# Patient Record
Sex: Female | Born: 1960 | Race: Black or African American | Hispanic: No | Marital: Single | Smoking: Never smoker
Health system: Southern US, Community
[De-identification: ages and names within clinical notes are randomized; demographics above are authoritative.]

## PROBLEM LIST (undated history)

## (undated) DIAGNOSIS — I214 Non-ST elevation (NSTEMI) myocardial infarction: Secondary | ICD-10-CM

## (undated) DIAGNOSIS — I2510 Atherosclerotic heart disease of native coronary artery without angina pectoris: Secondary | ICD-10-CM

## (undated) DIAGNOSIS — F4310 Post-traumatic stress disorder, unspecified: Secondary | ICD-10-CM

## (undated) DIAGNOSIS — F319 Bipolar disorder, unspecified: Secondary | ICD-10-CM

## (undated) DIAGNOSIS — F329 Major depressive disorder, single episode, unspecified: Secondary | ICD-10-CM

## (undated) DIAGNOSIS — F419 Anxiety disorder, unspecified: Secondary | ICD-10-CM

## (undated) DIAGNOSIS — E785 Hyperlipidemia, unspecified: Secondary | ICD-10-CM

## (undated) DIAGNOSIS — F429 Obsessive-compulsive disorder, unspecified: Secondary | ICD-10-CM

## (undated) HISTORY — DX: Non-ST elevation (NSTEMI) myocardial infarction: I21.4

## (undated) HISTORY — DX: Hyperlipidemia, unspecified: E78.5

## (undated) HISTORY — PX: COSMETIC SURGERY: SHX468

## (undated) HISTORY — DX: Atherosclerotic heart disease of native coronary artery without angina pectoris: I25.10

---

## 2017-02-08 ENCOUNTER — Emergency Department: Payer: Medicare PPO

## 2017-02-08 ENCOUNTER — Other Ambulatory Visit: Payer: Self-pay

## 2017-02-08 ENCOUNTER — Emergency Department
Admission: EM | Admit: 2017-02-08 | Discharge: 2017-02-09 | Disposition: A | Discharge status: Home / Self Care | Payer: Medicare PPO | Attending: Emergency Medicine | Admitting: Emergency Medicine

## 2017-02-08 DIAGNOSIS — F329 Major depressive disorder, single episode, unspecified: Secondary | ICD-10-CM

## 2017-02-08 DIAGNOSIS — R0789 Other chest pain: Secondary | ICD-10-CM | POA: Diagnosis not present

## 2017-02-08 DIAGNOSIS — R45851 Suicidal ideations: Secondary | ICD-10-CM | POA: Diagnosis not present

## 2017-02-08 DIAGNOSIS — F332 Major depressive disorder, recurrent severe without psychotic features: Secondary | ICD-10-CM | POA: Diagnosis not present

## 2017-02-08 DIAGNOSIS — F4310 Post-traumatic stress disorder, unspecified: Secondary | ICD-10-CM | POA: Diagnosis not present

## 2017-02-08 DIAGNOSIS — R202 Paresthesia of skin: Secondary | ICD-10-CM | POA: Diagnosis not present

## 2017-02-08 DIAGNOSIS — Z9114 Patient's other noncompliance with medication regimen: Secondary | ICD-10-CM | POA: Diagnosis present

## 2017-02-08 DIAGNOSIS — R200 Anesthesia of skin: Secondary | ICD-10-CM

## 2017-02-08 HISTORY — DX: Bipolar disorder, unspecified: F31.9

## 2017-02-08 HISTORY — DX: Anxiety disorder, unspecified: F41.9

## 2017-02-08 HISTORY — DX: Obsessive-compulsive disorder, unspecified: F42.9

## 2017-02-08 HISTORY — DX: Major depressive disorder, single episode, unspecified: F32.9

## 2017-02-08 HISTORY — DX: Post-traumatic stress disorder, unspecified: F43.10

## 2017-02-08 LAB — CBC
HEMATOCRIT: 40 % (ref 35.0–47.0)
Hemoglobin: 13.7 g/dL (ref 12.0–16.0)
MCH: 28.1 pg (ref 26.0–34.0)
MCHC: 34.3 g/dL (ref 32.0–36.0)
MCV: 82.1 fL (ref 80.0–100.0)
PLATELETS: 217 10*3/uL (ref 150–440)
RBC: 4.87 MIL/uL (ref 3.80–5.20)
RDW: 14.4 % (ref 11.5–14.5)
WBC: 5.6 10*3/uL (ref 3.6–11.0)

## 2017-02-08 LAB — URINE DRUG SCREEN, QUALITATIVE (ARMC ONLY)
AMPHETAMINES, UR SCREEN: NOT DETECTED
BENZODIAZEPINE, UR SCRN: POSITIVE — AB
Barbiturates, Ur Screen: NOT DETECTED
Cannabinoid 50 Ng, Ur ~~LOC~~: NOT DETECTED
Cocaine Metabolite,Ur ~~LOC~~: NOT DETECTED
MDMA (ECSTASY) UR SCREEN: NOT DETECTED
METHADONE SCREEN, URINE: NOT DETECTED
OPIATE, UR SCREEN: NOT DETECTED
Phencyclidine (PCP) Ur S: NOT DETECTED
Tricyclic, Ur Screen: NOT DETECTED

## 2017-02-08 LAB — COMPREHENSIVE METABOLIC PANEL
ALBUMIN: 4 g/dL (ref 3.5–5.0)
ALT: 12 U/L — ABNORMAL LOW (ref 14–54)
ANION GAP: 8 (ref 5–15)
AST: 18 U/L (ref 15–41)
Alkaline Phosphatase: 69 U/L (ref 38–126)
BILIRUBIN TOTAL: 0.6 mg/dL (ref 0.3–1.2)
BUN: 12 mg/dL (ref 6–20)
CHLORIDE: 107 mmol/L (ref 101–111)
CO2: 25 mmol/L (ref 22–32)
Calcium: 9 mg/dL (ref 8.9–10.3)
Creatinine, Ser: 0.83 mg/dL (ref 0.44–1.00)
GFR calc Af Amer: >60 mL/min (ref >60)
GFR calc non Af Amer: >60 mL/min (ref >60)
GLUCOSE: 92 mg/dL (ref 65–99)
POTASSIUM: 3.7 mmol/L (ref 3.5–5.1)
SODIUM: 140 mmol/L (ref 135–145)
TOTAL PROTEIN: 7.1 g/dL (ref 6.5–8.1)

## 2017-02-08 LAB — ETHANOL: Alcohol, Ethyl (B): <10 mg/dL (ref <10)

## 2017-02-08 LAB — ACETAMINOPHEN LEVEL

## 2017-02-08 LAB — SALICYLATE LEVEL: Salicylate Lvl: <7 mg/dL (ref 2.8–30.0)

## 2017-02-08 MED ORDER — LORAZEPAM 2 MG PO TABS
ORAL_TABLET | ORAL | Status: AC
  Administered 2017-02-08: 2 mg via ORAL
  Filled 2017-02-08: qty 1

## 2017-02-08 MED ORDER — VENLAFAXINE HCL ER 75 MG PO CP24
75.0000 mg | ORAL_CAPSULE | Freq: Every day | ORAL | Status: DC
  Administered 2017-02-09: 75 mg via ORAL
  Filled 2017-02-08: qty 1

## 2017-02-08 MED ORDER — LORAZEPAM 2 MG PO TABS
2.0000 mg | ORAL_TABLET | Freq: Every day | ORAL | Status: DC
  Administered 2017-02-08: 2 mg via ORAL

## 2017-02-08 NOTE — ED Notes (Signed)
Patient transported to CT with a tech per CT request.

## 2017-02-08 NOTE — ED Triage Notes (Addendum)
Pt to the ER via ems, pt is SI x 2 days. Walked to the neighbors house around the corner and spoke with them and they called 911 for her. Pt also reports chest pain earlier associated with her anxiety and numbness to her right hand and right leg.
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
## 2017-02-08 NOTE — ED Notes (Signed)
Pt is alert and oriented on admission to the [facility]. Pt mood is depressed and affect is sad/tearful. Pt does state that she feels safe at this time. Writer discussed tx plan and administered bedtime medications. 15 minute checks are ongoing for safety.

## 2017-02-08 NOTE — ED Notes (Signed)
TTS with pt  
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
## 2017-02-08 NOTE — ED Provider Notes (Addendum)
[facility] Emergency Department Provider Note  ____________________________________________  Time seen: Approximately 8:23 PM  I have reviewed the triage vital signs and the nursing notes.   HISTORY  Chief Complaint Suicidal    HPI Brianna Mclean is a 56 y.o. female w/ a hx of bipolar d/o, PTSD, anxiety, OCD presenting w/ SI, off meds. The patient reports that she has significant stressors including taking care of her children and grandchildren, so she has discontinued her medications to "be more awake for them."  Over the last several weeks, the patient has become suicidal without a plan.  No HI or hallucinations.  Medically, the patient reports several months of a constant numbness in the right fingertips which occasionally radiates up the arm, most associated with taking care of small children.  She has no associated shortness of breath, headache, visual changes or speech changes.  Occasionally she gets a chest tightness which is nonexertional, and last for several seconds.   Past Medical History:  Diagnosis Date  . Anxiety   . Bipolar 1 disorder, depressed (HCC)   . Major depression   . Obsessive compulsive disorder   . PTSD (post-traumatic stress disorder)     There are no active problems to display for this patient.     Current Outpatient Rx  . Order #: [id number] Class: Historical Med  . Order #: [id number] Class: Historical Med    Allergies Percocet [oxycodone-acetaminophen]  No family history on file.  Social History Social History   Tobacco Use  . Smoking status: Never Smoker  . Smokeless tobacco: Never Used  Substance Use Topics  . Alcohol use: No    Frequency: Never  . Drug use: No    Review of Systems Constitutional: No fever/chills. Eyes: No visual changes. ENT: No sore throat. No congestion or rhinorrhea. Cardiovascular: Positive chest pain. Denies palpitations. Respiratory: Denies shortness of breath.  No  cough. Gastrointestinal: No abdominal pain.  No nausea, no vomiting.  No diarrhea.  No constipation. Genitourinary: Negative for dysuria. Musculoskeletal: Negative for back pain. Skin: Negative for rash. Neurological: Negative for headaches. No focal , tingling or weakness.  Positive right fingertip numbness. Psychiatric:Positive SI.  Negative HI or hallucinations ____________________________________________   PHYSICAL EXAM:  VITAL SIGNS: ED Triage Vitals [02/08/17 1937]  Enc Vitals Group     BP      Pulse      Resp      Temp      Temp src      SpO2      Weight 165 lb (74.8 kg)     Height 5\' 4"  (1.626 m)     Head Circumference      Peak Flow      Pain Score      Pain Loc      Pain Edu?      Excl. in GC?     Constitutional: Alert and oriented.  Tearful but well appearing and in no acute distress. Answers questions appropriately. Eyes: Conjunctivae are normal.  EOMI. No scleral icterus. Head: Atraumatic. Nose: No congestion/rhinnorhea. Mouth/Throat: Mucous membranes are moist.  Neck: No stridor.  Supple.  No JVD.  No meningismus. Cardiovascular: Normal rate, regular rhythm. No murmurs, rubs or gallops.  Respiratory: Normal respiratory effort.  No accessory muscle use or retractions. Lungs CTAB.  No wheezes, rales or ronchi. Gastrointestinal: Soft, nontender and nondistended.  No guarding or rebound.  No peritoneal signs. Musculoskeletal: No LE edema. No ttp in the calves or palpable cords.  Negative Homan's  sign. Neurologic: Alert and oriented 3. Speech is clear. Face and smile symmetric. Tongue is midline. No pronator drift. 5 out of 5 grip, biceps, triceps, hip flexors, plantar flexion and dorsiflexion. Normal sensation to light touch in the L upper and lower extremities, and face.  Decreased sensation to light touch in the entire RUE and RLE. Normal Gait without ataxia. Skin:  Skin is warm, dry and intact. No rash noted. Psychiatric: Mood and affect are normal. Speech  and behavior are normal.  Normal judgement.  ____________________________________________   LABS (all labs ordered are listed, but only abnormal results are displayed)  Labs Reviewed  COMPREHENSIVE METABOLIC PANEL  ETHANOL  SALICYLATE LEVEL  ACETAMINOPHEN LEVEL  CBC  URINE DRUG SCREEN, QUALITATIVE ([facility] ONLY)  POC URINE PREG, ED   ____________________________________________  EKG  ED ECG REPORT I, Rockne MenghiniNorman, Anne-Caroline, the attending physician, personally viewed and interpreted this ECG.   Date: 02/08/2017  EKG Time: 2157  Rate: 63  Rhythm: normal sinus rhythm  Axis: normal  Intervals:none  ST&T Change: No STEMI  ____________________________________________  RADIOLOGY  No results found.  ____________________________________________   PROCEDURES  Procedure(s) performed: None  Procedures  Critical Care performed: No ____________________________________________   INITIAL IMPRESSION / ASSESSMENT AND PLAN / ED COURSE  Pertinent labs & imaging results that were available during my care of the patient were reviewed by me and considered in my medical decision making (see chart for details).  56 y.o. female with a history of multiple psychiatric illnesses, off her medications, presenting with SI.  The patient also has several months of a constant right upper extremity numbness and some intermittent atypical chest pain.  We will get a screening EKG, but her chest pain does not sound typical of ACS or MI.  In addition, we will get a CT scan of the head, but given the length of the patient's numbness, it is unlikely that her symptoms are related to stroke, particularly an acute stroke.  ----------------------------------------- 10:43 PM on 02/08/2017 -----------------------------------------  The patient CT scan does not show any acute intracranial process.  Her EKG is reassuring.  At this time, the patient is medically cleared for psychiatric  disposition.  ____________________________________________  FINAL CLINICAL IMPRESSION(S) / ED DIAGNOSES  Final diagnoses:  None         NEW MEDICATIONS STARTED DURING THIS VISIT:  This SmartLink is deprecated. Use AVSMEDLIST instead to display the medication list for a patient.    Rockne MenghiniNorman, Anne-Caroline, MD 02/08/17 2204    Rockne MenghiniNorman, Anne-Caroline, MD 02/08/17 [id number]    Rockne MenghiniNorman, Anne-Caroline, MD 02/08/17 [phone]

## 2017-02-08 NOTE — ED Notes (Signed)
ED Provider at bedside. 

## 2017-02-09 DIAGNOSIS — F329 Major depressive disorder, single episode, unspecified: Secondary | ICD-10-CM

## 2017-02-09 DIAGNOSIS — F4310 Post-traumatic stress disorder, unspecified: Secondary | ICD-10-CM
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
## 2017-02-09 NOTE — ED Provider Notes (Signed)
Dr. Toni Amendlapacs recommends discharge.   Merrily Brittleifenbark, Nicolette Gieske, MD 02/09/17 1443

## 2017-02-09 NOTE — Discharge Instructions (Signed)
It was a pleasure to take care of you today, and thank you for coming to our emergency department.  If you have any questions or concerns before leaving please ask the nurse to grab me and I'm more than happy to go through your aftercare instructions again.  If you were prescribed any opioid pain medication today such as Norco, Vicodin, Percocet, morphine, hydrocodone, or oxycodone please make sure you do not drive when you are taking this medication as it can alter your ability to drive safely.  If you have any concerns once you are home that you are not improving or are in fact getting worse before you can make it to your follow-up appointment, please do not hesitate to call 911 and come back for further evaluation.  Merrily BrittleNeil Aleiyah Halpin, MD  Results for orders placed or performed during the hospital encounter of 02/08/17  Comprehensive metabolic panel  Result Value Ref Range   Sodium 140 135 - 145 mmol/L   Potassium 3.7 3.5 - 5.1 mmol/L   Chloride 107 101 - 111 mmol/L   CO2 25 22 - 32 mmol/L   Glucose, Bld 92 65 - 99 mg/dL   BUN 12 6 - 20 mg/dL   Creatinine, Ser 1.610.83 0.44 - 1.00 mg/dL   Calcium 9.0 8.9 - 09.610.3 mg/dL   Total Protein 7.1 6.5 - 8.1 g/dL   Albumin 4.0 3.5 - 5.0 g/dL   AST 18 15 - 41 U/L   ALT 12 (L) 14 - 54 U/L   Alkaline Phosphatase 69 38 - 126 U/L   Total Bilirubin 0.6 0.3 - 1.2 mg/dL   GFR calc non Af Amer >60 >60 mL/min   GFR calc Af Amer >60 >60 mL/min   Anion gap 8 5 - 15  Ethanol  Result Value Ref Range   Alcohol, Ethyl (B) <10 <10 mg/dL  Salicylate level  Result Value Ref Range   Salicylate Lvl <7.0 2.8 - 30.0 mg/dL  Acetaminophen level  Result Value Ref Range   Acetaminophen (Tylenol), Serum <10 (L) 10 - 30 ug/mL  cbc  Result Value Ref Range   WBC 5.6 3.6 - 11.0 K/uL   RBC 4.87 3.80 - 5.20 MIL/uL   Hemoglobin 13.7 12.0 - 16.0 g/dL   HCT 04.540.0 40.935.0 - 81.147.0 %   MCV 82.1 80.0 - 100.0 fL   MCH 28.1 26.0 - 34.0 pg   MCHC 34.3 32.0 - 36.0 g/dL   RDW 91.414.4 78.211.5 -  95.614.5 %   Platelets 217 150 - 440 K/uL  Urine Drug Screen, Qualitative  Result Value Ref Range   Tricyclic, Ur Screen NONE DETECTED NONE DETECTED   Amphetamines, Ur Screen NONE DETECTED NONE DETECTED   MDMA (Ecstasy)Ur Screen NONE DETECTED NONE DETECTED   Cocaine Metabolite,Ur [place] NONE DETECTED NONE DETECTED   Opiate, Ur Screen NONE DETECTED NONE DETECTED   Phencyclidine (PCP) Ur S NONE DETECTED NONE DETECTED   Cannabinoid 50 Ng, Ur [place] NONE DETECTED NONE DETECTED   Barbiturates, Ur Screen NONE DETECTED NONE DETECTED   Benzodiazepine, Ur Scrn POSITIVE (A) NONE DETECTED   Methadone Scn, Ur NONE DETECTED NONE DETECTED   Ct Head Wo Contrast  Result Date: 02/08/2017 CLINICAL DATA:  Right hand and leg numbness. Subacute neurologic deficit. EXAM: CT HEAD WITHOUT CONTRAST TECHNIQUE: Contiguous axial images were obtained from the base of the skull through the vertex without intravenous contrast. COMPARISON:  None. FINDINGS: Brain: No intracranial hemorrhage, mass effect, or midline shift. No hydrocephalus. The basilar cisterns are  patent. No evidence of territorial infarct or acute ischemia. No extra-axial or intracranial fluid collection. Vascular: No hyperdense vessel or unexpected calcification. Skull: No fracture or focal lesion. Sinuses/Orbits: Frontal sinuses are hypo pneumatized. No acute finding. Other: None. IMPRESSION: No acute intracranial abnormality. Electronically Signed   By: Rubye OaksMelanie  Ehinger M.D.   On: 02/08/2017 21:13

## 2017-02-09 NOTE — ED Notes (Signed)
Pt A & O X4. Denies SI, HI, AVh and pain at this time. Presents with depressed affect and mood with poor eye contact. States "I feel ok, compare to yesterday". Reported poor appetite, "I don't feel like eating now, I just want to rest more". Pt d/c home as per MD's order. Compliant with medications when offered. Denies adverse drug reactions when assessed. D/C instructions reviewed with pt who verbalized understanding. Compliance with d/c instructions encouraged. Emotional support provided. VSS, pt ambulatory with a steady gait. Q 15 minutes safety checks maintained till time of d/c without self harm gestures. Appears to be in no physical distress at time of departure. Pt was picked up in the ED front lobby by her daughter.

## 2017-08-10 ENCOUNTER — Emergency Department: Payer: Medicare PPO

## 2017-08-10 ENCOUNTER — Inpatient Hospital Stay
Admission: EM | Admit: 2017-08-10 | Discharge: 2017-08-12 | DRG: 280 | Disposition: A | Discharge status: Home / Self Care | Payer: Medicare PPO | Attending: Family Medicine | Admitting: Family Medicine

## 2017-08-10 ENCOUNTER — Encounter
Admission: EM | Disposition: A | Discharge status: Home / Self Care | Payer: Self-pay | Source: Home / Self Care | Attending: Family Medicine

## 2017-08-10 ENCOUNTER — Other Ambulatory Visit: Payer: Self-pay

## 2017-08-10 DIAGNOSIS — I214 Non-ST elevation (NSTEMI) myocardial infarction: Secondary | ICD-10-CM | POA: Diagnosis present

## 2017-08-10 DIAGNOSIS — E785 Hyperlipidemia, unspecified: Secondary | ICD-10-CM | POA: Diagnosis present

## 2017-08-10 DIAGNOSIS — R001 Bradycardia, unspecified: Secondary | ICD-10-CM | POA: Diagnosis present

## 2017-08-10 DIAGNOSIS — I2510 Atherosclerotic heart disease of native coronary artery without angina pectoris: Secondary | ICD-10-CM

## 2017-08-10 DIAGNOSIS — I213 ST elevation (STEMI) myocardial infarction of unspecified site: Secondary | ICD-10-CM | POA: Diagnosis present

## 2017-08-10 DIAGNOSIS — Z823 Family history of stroke: Secondary | ICD-10-CM | POA: Diagnosis not present

## 2017-08-10 DIAGNOSIS — Z8349 Family history of other endocrine, nutritional and metabolic diseases: Secondary | ICD-10-CM | POA: Diagnosis not present

## 2017-08-10 DIAGNOSIS — F411 Generalized anxiety disorder: Secondary | ICD-10-CM | POA: Diagnosis present

## 2017-08-10 DIAGNOSIS — F419 Anxiety disorder, unspecified: Secondary | ICD-10-CM | POA: Diagnosis present

## 2017-08-10 DIAGNOSIS — Z7982 Long term (current) use of aspirin: Secondary | ICD-10-CM | POA: Diagnosis not present

## 2017-08-10 DIAGNOSIS — I2542 Coronary artery dissection: Secondary | ICD-10-CM | POA: Diagnosis present

## 2017-08-10 DIAGNOSIS — F329 Major depressive disorder, single episode, unspecified: Secondary | ICD-10-CM | POA: Diagnosis present

## 2017-08-10 HISTORY — PX: LEFT HEART CATH AND CORONARY ANGIOGRAPHY: CATH118249

## 2017-08-10 HISTORY — DX: Non-ST elevation (NSTEMI) myocardial infarction: I21.4

## 2017-08-10 LAB — GLUCOSE, CAPILLARY: Glucose-Capillary: 85 mg/dL (ref 65–99)

## 2017-08-10 LAB — CBC
HCT: 40.5 % (ref 35.0–47.0)
Hemoglobin: 13.9 g/dL (ref 12.0–16.0)
MCH: 28.3 pg (ref 26.0–34.0)
MCHC: 34.4 g/dL (ref 32.0–36.0)
MCV: 82.4 fL (ref 80.0–100.0)
PLATELETS: 261 10*3/uL (ref 150–440)
RBC: 4.91 MIL/uL (ref 3.80–5.20)
RDW: 14.4 % (ref 11.5–14.5)
WBC: 8.3 10*3/uL (ref 3.6–11.0)

## 2017-08-10 LAB — BASIC METABOLIC PANEL
Anion gap: 11 (ref 5–15)
BUN: 14 mg/dL (ref 6–20)
CALCIUM: 9.2 mg/dL (ref 8.9–10.3)
CHLORIDE: 105 mmol/L (ref 101–111)
CO2: 24 mmol/L (ref 22–32)
Creatinine, Ser: 0.81 mg/dL (ref 0.44–1.00)
GFR calc non Af Amer: >60 mL/min (ref >60)
Glucose, Bld: 125 mg/dL — ABNORMAL HIGH (ref 65–99)
Potassium: 3.5 mmol/L (ref 3.5–5.1)
SODIUM: 140 mmol/L (ref 135–145)

## 2017-08-10 LAB — MRSA PCR SCREENING: MRSA BY PCR: POSITIVE — AB

## 2017-08-10 LAB — TROPONIN I

## 2017-08-10 SURGERY — LEFT HEART CATH AND CORONARY ANGIOGRAPHY
Anesthesia: Moderate Sedation

## 2017-08-10 MED ORDER — GABAPENTIN 600 MG PO TABS
600.0000 mg | ORAL_TABLET | Freq: Three times a day (TID) | ORAL | Status: DC
  Administered 2017-08-10 – 2017-08-12 (×5): 600 mg via ORAL
  Filled 2017-08-10 (×6): qty 1

## 2017-08-10 MED ORDER — FENTANYL CITRATE (PF) 100 MCG/2ML IJ SOLN
INTRAMUSCULAR | Status: DC | PRN
  Administered 2017-08-10: 25 ug via INTRAVENOUS

## 2017-08-10 MED ORDER — HEPARIN (PORCINE) IN NACL 100-0.45 UNIT/ML-% IJ SOLN
850.0000 [IU]/h | INTRAMUSCULAR | Status: DC
  Filled 2017-08-10: qty 250

## 2017-08-10 MED ORDER — SODIUM CHLORIDE 0.9% FLUSH
3.0000 mL | Freq: Two times a day (BID) | INTRAVENOUS | Status: DC
  Administered 2017-08-11 (×2): 3 mL via INTRAVENOUS

## 2017-08-10 MED ORDER — SODIUM CHLORIDE 0.9% FLUSH: 3.0000 mL | INTRAVENOUS | Status: DC | PRN

## 2017-08-10 MED ORDER — NITROGLYCERIN 0.4 MG SL SUBL: 0.4000 mg | SUBLINGUAL_TABLET | SUBLINGUAL | Status: DC | PRN

## 2017-08-10 MED ORDER — HEPARIN (PORCINE) IN NACL 2-0.9 UNITS/ML
INTRAMUSCULAR | Status: AC | PRN
  Administered 2017-08-10: 1000 mL via INTRA_ARTERIAL

## 2017-08-10 MED ORDER — VENLAFAXINE HCL ER 75 MG PO CP24
225.0000 mg | ORAL_CAPSULE | Freq: Every day | ORAL | Status: DC
  Administered 2017-08-11 – 2017-08-12 (×2): 225 mg via ORAL
  Filled 2017-08-10 (×2): qty 3

## 2017-08-10 MED ORDER — MIDAZOLAM HCL 2 MG/2ML IJ SOLN
INTRAMUSCULAR | Status: AC
  Filled 2017-08-10: qty 2

## 2017-08-10 MED ORDER — HEPARIN SODIUM (PORCINE) 5000 UNIT/ML IJ SOLN
4000.0000 [IU] | Freq: Once | INTRAMUSCULAR | Status: AC
  Administered 2017-08-10: 4000 [IU] via INTRAVENOUS

## 2017-08-10 MED ORDER — FENTANYL CITRATE (PF) 100 MCG/2ML IJ SOLN
INTRAMUSCULAR | Status: AC
  Filled 2017-08-10: qty 2

## 2017-08-10 MED ORDER — SODIUM CHLORIDE 0.9 % IV SOLN: 250.0000 mL | INTRAVENOUS | Status: DC | PRN

## 2017-08-10 MED ORDER — VERAPAMIL HCL 2.5 MG/ML IV SOLN
INTRAVENOUS | Status: DC | PRN
  Administered 2017-08-10: 5 mg via INTRAVENOUS

## 2017-08-10 MED ORDER — LIDOCAINE HCL (PF) 1 % IJ SOLN
INTRAMUSCULAR | Status: AC
  Filled 2017-08-10: qty 30

## 2017-08-10 MED ORDER — ASPIRIN 81 MG PO CHEW
81.0000 mg | CHEWABLE_TABLET | Freq: Every day | ORAL | Status: DC
  Administered 2017-08-11 – 2017-08-12 (×2): 81 mg via ORAL
  Filled 2017-08-10 (×2): qty 1

## 2017-08-10 MED ORDER — SODIUM CHLORIDE 0.9 % IV SOLN
INTRAVENOUS | Status: AC
  Administered 2017-08-10: 125 mL/h via INTRAVENOUS

## 2017-08-10 MED ORDER — MIDAZOLAM HCL 2 MG/2ML IJ SOLN
INTRAMUSCULAR | Status: DC | PRN
  Administered 2017-08-10: 0.5 mg via INTRAVENOUS

## 2017-08-10 MED ORDER — LORAZEPAM 2 MG PO TABS: 2.0000 mg | ORAL_TABLET | Freq: Two times a day (BID) | ORAL | Status: DC | PRN

## 2017-08-10 MED ORDER — ATORVASTATIN CALCIUM 20 MG PO TABS
40.0000 mg | ORAL_TABLET | Freq: Every day | ORAL | Status: DC
  Administered 2017-08-10 – 2017-08-11 (×2): 40 mg via ORAL
  Filled 2017-08-10 (×2): qty 2

## 2017-08-10 MED ORDER — HEPARIN (PORCINE) IN NACL 1000-0.9 UT/500ML-% IV SOLN
INTRAVENOUS | Status: AC
  Filled 2017-08-10: qty 1000

## 2017-08-10 MED ORDER — VERAPAMIL HCL 2.5 MG/ML IV SOLN
INTRAVENOUS | Status: AC
  Filled 2017-08-10: qty 2

## 2017-08-10 MED ORDER — ONDANSETRON HCL 4 MG/2ML IJ SOLN: 4.0000 mg | Freq: Four times a day (QID) | INTRAMUSCULAR | Status: DC | PRN

## 2017-08-10 MED ORDER — IOPAMIDOL (ISOVUE-300) INJECTION 61%
INTRAVENOUS | Status: DC | PRN
  Administered 2017-08-10: 60 mL via INTRA_ARTERIAL

## 2017-08-10 MED ORDER — CARVEDILOL 3.125 MG PO TABS
3.1250 mg | ORAL_TABLET | Freq: Two times a day (BID) | ORAL | Status: DC
  Administered 2017-08-10 – 2017-08-12 (×4): 3.125 mg via ORAL
  Filled 2017-08-10 (×4): qty 1

## 2017-08-10 MED ORDER — ACETAMINOPHEN 325 MG PO TABS
650.0000 mg | ORAL_TABLET | ORAL | Status: DC | PRN
  Administered 2017-08-11: 650 mg via ORAL
  Filled 2017-08-10: qty 2

## 2017-08-10 SURGICAL SUPPLY — 11 items
CATH INFINITI 5 FR JL3.5 (CATHETERS) ×3 IMPLANT
CATH INFINITI 5FR ANG PIGTAIL (CATHETERS) ×3 IMPLANT
CATH INFINITI JR4 5F (CATHETERS) ×3 IMPLANT
DEVICE INFLAT 30 PLUS (MISCELLANEOUS) IMPLANT
DEVICE RAD COMP TR BAND LRG (VASCULAR PRODUCTS) ×3 IMPLANT
KIT MANI 3VAL PERCEP (MISCELLANEOUS) ×3 IMPLANT
NEEDLE PERC 21GX4CM (NEEDLE) ×3 IMPLANT
PACK CARDIAC CATH (CUSTOM PROCEDURE TRAY) ×3 IMPLANT
SHEATH RAIN RADIAL 21G 6FR (SHEATH) ×3 IMPLANT
WIRE HITORQ VERSACORE ST 145CM (WIRE) ×3 IMPLANT
WIRE ROSEN-J .035X260CM (WIRE) ×3 IMPLANT

## 2017-08-10 NOTE — H&P (Signed)
[facility] [place] [facility]   PATIENT NAME: Brianna Mclean    MR#:  [id number]  DATE OF BIRTH:  1960-05-30  DATE OF ADMISSION:  08/10/2017  PRIMARY CARE PHYSICIAN: Patient, No Pcp Per   REQUESTING/REFERRING PHYSICIAN: End, MD  CHIEF COMPLAINT:   Chief Complaint  Patient presents with  . Chest Pain    HISTORY OF PRESENT ILLNESS:  Brianna Mclean  is a 57 y.o. female who presents with chest pain.  Patient states started this afternoon, and that it was "not a panic attack".  She came to the ED for evaluation, and here had an initial negative troponin, but did have some EKG changes.  While in the ED her rhythm changed into wide-complex, and she had persistent symptoms.  Despite negative troponin she was taken to Cath Lab by cardiology and found to have possible dissection -OM 2.  Patient was stable and pain-free after cath, she was moved to the ICU and hospitalist were called  PAST MEDICAL HISTORY:   Past Medical History:  Diagnosis Date  . Anxiety   . Bipolar 1 disorder, depressed (HCC)   . Major depression   . Obsessive compulsive disorder   . PTSD (post-traumatic stress disorder)      PAST SURGICAL HISTORY:   Past Surgical History:  Procedure Laterality Date  . COSMETIC SURGERY       SOCIAL HISTORY:   Social History   Tobacco Use  . Smoking status: Never Smoker  . Smokeless tobacco: Never Used  Substance Use Topics  . Alcohol use: No    Frequency: Never     FAMILY HISTORY:   Family History  Problem Relation Age of Onset  . Anxiety disorder Mother   . Hyperlipidemia Mother   . Stroke Mother   . Depression Father      DRUG ALLERGIES:   Allergies  Allergen Reactions  . Percocet [Oxycodone-Acetaminophen] Itching    MEDICATIONS AT HOME:   Prior to Admission medications   Medication Sig Start Date End Date Taking? Authorizing Provider  aspirin 325 MG tablet Take 325 mg by mouth daily.   Yes [provider]   gabapentin (NEURONTIN) 600 MG tablet Take 600 mg by mouth 3 (three) times daily. 07/16/17  Yes [provider]  LORazepam (ATIVAN) 2 MG tablet Take 1 tablet by mouth 2 (two) times daily as needed. 12/17/16  Yes [provider]  venlafaxine XR (EFFEXOR-XR) 75 MG 24 hr capsule Take 225 mg by mouth daily. With food 01/14/17  Yes [provider]    REVIEW OF SYSTEMS:  Review of Systems  Constitutional: Negative for chills, fever, malaise/fatigue and weight loss.  HENT: Negative for ear pain, hearing loss and tinnitus.   Eyes: Negative for blurred vision, double vision, pain and redness.  Respiratory: Positive for shortness of breath. Negative for cough and hemoptysis.   Cardiovascular: Positive for chest pain. Negative for palpitations, orthopnea and leg swelling.  Gastrointestinal: Negative for abdominal pain, constipation, diarrhea, nausea and vomiting.  Genitourinary: Negative for dysuria, frequency and hematuria.  Musculoskeletal: Negative for back pain, joint pain and neck pain.  Skin:       No acne, rash, or lesions  Neurological: Negative for dizziness, tremors, focal weakness and weakness.  Endo/Heme/Allergies: Negative for polydipsia. Does not bruise/bleed easily.  Psychiatric/Behavioral: Negative for depression. The patient is not nervous/anxious and does not have insomnia.      VITAL SIGNS:   Vitals:   08/10/17 1739 08/10/17 1740 08/10/17 1941  08/10/17 2037  BP: 132/78   123/70  Pulse: 61     Resp: 18     Temp: 98.1 F (36.7 C)   (!) 97.5 F (36.4 C)  TempSrc: Oral   Oral  SpO2: 97%  100%   Weight:  72.6 kg (160 lb)  70.9 kg (156 lb 4.9 oz)  Height:   (1.6 m)   (1.6 m)   Wt Readings from Last 3 Encounters:  08/10/17 70.9 kg (156 lb 4.9 oz)  02/08/17 74.8 kg (165 lb)    PHYSICAL EXAMINATION:  Physical Exam  Vitals reviewed. Constitutional: She is oriented to person, place, and time. She appears well-developed and well-nourished. No  distress.  HENT:  Head: Normocephalic and atraumatic.  Mouth/Throat: Oropharynx is clear and moist.  Eyes: Pupils are equal, round, and reactive to light. Conjunctivae and EOM are normal. No scleral icterus.  Neck: Normal range of motion. Neck supple. No JVD present. No thyromegaly present.  Cardiovascular: Normal rate, regular rhythm and intact distal pulses. Exam reveals no gallop and no friction rub.  No murmur heard. Respiratory: Effort normal and breath sounds normal. No respiratory distress. She has no wheezes. She has no rales.  GI: Soft. Bowel sounds are normal. She exhibits no distension. There is no tenderness.  Musculoskeletal: Normal range of motion. She exhibits no edema.  No arthritis, no gout  Lymphadenopathy:    She has no cervical adenopathy.  Neurological: She is alert and oriented to person, place, and time. No cranial nerve deficit.  No dysarthria, no aphasia  Skin: Skin is warm and dry. No rash noted. No erythema.  Psychiatric: She has a normal mood and affect. Her behavior is normal. Judgment and thought content normal.    LABORATORY PANEL:   CBC Recent Labs  Lab 08/10/17 1741  WBC 8.3  HGB 13.9  HCT 40.5  PLT 261   ------------------------------------------------------------------------------------------------------------------  Chemistries  Recent Labs  Lab 08/10/17 1741  NA 140  K 3.5  CL 105  CO2 24  GLUCOSE 125*  BUN 14  CREATININE 0.81  CALCIUM 9.2   ------------------------------------------------------------------------------------------------------------------  Cardiac Enzymes Recent Labs  Lab 08/10/17 1741  TROPONINI <0.03   ------------------------------------------------------------------------------------------------------------------  RADIOLOGY:  Dg Chest 2 View  Result Date: 08/10/2017 CLINICAL DATA:  Short of breath and chest pain EXAM: CHEST - 2 VIEW COMPARISON:  None. FINDINGS: Normal heart size. Lungs clear. No  pneumothorax. No pleural effusion. IMPRESSION: No active cardiopulmonary disease. Electronically Signed   By: Jolaine Click M.D.   On: 08/10/2017 19:13    EKG:   Orders placed or performed during the hospital encounter of 08/10/17  . ED EKG within 10 minutes  . ED EKG within 10 minutes  . EKG 12-Lead  . EKG 12-Lead  . EKG 12-Lead  . EKG 12-Lead  . ED EKG within 10 minutes  . ED EKG within 10 minutes  . EKG 12-Lead  . EKG 12-Lead  . EKG 12-Lead  . EKG 12-Lead  . EKG 12-Lead  . EKG 12-Lead  . EKG 12-Lead  . EKG 12-Lead  . EKG 12-Lead  . EKG 12-Lead    IMPRESSION AND PLAN:  Principal Problem:   Non-ST elevation (NSTEMI) myocardial infarction (HCC) -60% blockage due to tapering and OM 2, with strong consideration for possible dissection.  Per cardiology's recommendations we will treat medically.  Cardiology following Active Problems:   Major depression -continue home dose antidepressants   Anxiety -continue home dose anxiolytic  Chart review performed  and case discussed with ED provider. Labs, imaging and/or ECG reviewed by provider and discussed with patient/family. Management plans discussed with the patient and/or family.  DVT PROPHYLAXIS: SubQ heparin  GI PROPHYLAXIS: None  ADMISSION STATUS: Inpatient  CODE STATUS: Full    Code Status Orders  (From admission, onward)        Start     Ordered   08/10/17 2037  Full code  Continuous     08/10/17 2036    Code Status History    This patient has a current code status but no historical code status.      TOTAL TIME TAKING CARE OF THIS PATIENT: 45 minutes.   Brianna Mclean 08/10/2017, 8:58 PM  [facility]  Office  [phone]  CC: Primary care physician; Patient, No Pcp Per  Note:  This document was prepared using Dragon voice recognition software and may include unintentional dictation errors.

## 2017-08-10 NOTE — ED Triage Notes (Signed)
Pt arrives to ED via [facility] from [facility] with sudden onset of right-sided CP without radiation. Pt took 324 aspirin PTA and given 1 dose of SL Nitro spray. EMS reports VS: BP 132/86, 99% RA, HR 64. Pt denies h/x of cardiac issues. Pt SHOB; no N/V. Pt is A&O, in NAD; RR even, regular, and unlabored.

## 2017-08-10 NOTE — ED Notes (Signed)
Per Dr Fanny Bien, repeat EKG in 15 minutes.

## 2017-08-10 NOTE — Consult Note (Signed)
Name: Brianna Mclean MRN: [id number] DOB: 08/11/1960    ADMISSION DATE:  08/10/2017 CONSULTATION DATE: 08/10/2017  REFERRING MD : Dr. Okey Dupre   CHIEF COMPLAINT: Chest Pain   BRIEF PATIENT DESCRIPTION:  57 yo female admitted with NSTEMI s/p cardiac catheterization revealing no critical coronary artery disease, there was abrupt tapering with up to 60% stenosis of OM2 plans to medically manage   SIGNIFICANT EVENTS/STUDIES:  05/29 Pt admitted to ICU  05/29 Cardiac catheterization revealed no critical coronary artery disease. There is abrupt tapering with up to 60% stenosis of OM2.  Given angiographic appearance and lack of other CAD, spontaneous coronary artery dissection and vasospasm are considerations. Normal left ventricular systolic function with question of subtle mid inferior hypokinesis. Mildly elevated LVOT/aortic valve gradient.  Recommend echocardiographic correlation.  HISTORY OF PRESENT ILLNESS:   This is a 57 yo female with a PMH of PTSD, Obsessive Compulsive Disorder, Major Depression, Bipolar 1 Disorder, and Anxiety.  She presented to [facility] ER via EMS on 05/29 with acute onset of 10/10 non radiating substernal chest pain.  She took aspirin, however the pain persisted for several minutes prompting EMS notification.  Upon EMS arrival the pt received sublingual nitro with chest pain decreasing to 2/10.  In the ER initial EKG concerning for subtle inferior ST elevation not clearly meeting STEMI criteria.  She then converted to a wide complex rhythm for several minutes then returned to sinus rhythm.  Due to continued chest pain pt transferred to cardiac cath lab for possible intervention by cardiology.  Cardiac Cath revealed  no critical coronary artery disease, there was abrupt tapering with up to 60% stenosis of OM2 plans to medically manage.  She was subsequently admitted to ICU post cath for further workup and treatment.    PAST MEDICAL HISTORY :   has a past medical history of Anxiety,  Bipolar 1 disorder, depressed (HCC), Major depression, Obsessive compulsive disorder, and PTSD (post-traumatic stress disorder).  has a past surgical history that includes Cosmetic surgery. Prior to Admission medications   Medication Sig Start Date End Date Taking? Authorizing Provider  aspirin 325 MG tablet Take 325 mg by mouth daily.   Yes [provider]  gabapentin (NEURONTIN) 600 MG tablet Take 600 mg by mouth 3 (three) times daily. 07/16/17  Yes [provider]  LORazepam (ATIVAN) 2 MG tablet Take 1 tablet by mouth 2 (two) times daily as needed. 12/17/16  Yes [provider]  venlafaxine XR (EFFEXOR-XR) 75 MG 24 hr capsule Take 225 mg by mouth daily. With food 01/14/17  Yes [provider]   Allergies  Allergen Reactions  . Percocet [Oxycodone-Acetaminophen] Itching    FAMILY HISTORY:  family history includes Anxiety disorder in her mother; Depression in her father; Hyperlipidemia in her mother; Stroke in her mother. SOCIAL HISTORY:  reports that she has never smoked. She has never used smokeless tobacco. She reports that she does not drink alcohol or use drugs.  REVIEW OF SYSTEMS: Positives in BOLD  Constitutional: Negative for fever, chills, weight loss, malaise/fatigue and diaphoresis.  HENT: Negative for hearing loss, ear pain, nosebleeds, congestion, sore throat, neck pain, tinnitus and ear discharge.   Eyes: Negative for blurred vision, double vision, photophobia, pain, discharge and redness.  Respiratory: Negative for cough, hemoptysis, sputum production, shortness of breath, wheezing and stridor.   Cardiovascular: chest pain, palpitations, orthopnea, claudication, leg swelling and PND.  Gastrointestinal: Negative for heartburn, nausea, vomiting, abdominal pain, diarrhea, constipation, blood in stool and melena.  Genitourinary:  Negative for dysuria, urgency, frequency, hematuria and flank pain.  Musculoskeletal: Negative for myalgias, back pain,  joint pain and falls.  Skin: Negative for itching and rash.  Neurological: Negative for dizziness, tingling, tremors, sensory change, speech change, focal weakness, seizures, loss of consciousness, weakness and headaches.  Endo/Heme/Allergies: Negative for environmental allergies and polydipsia. Does not bruise/bleed easily.  SUBJECTIVE:  No complaints at this time   VITAL SIGNS: Temp:  [97.5 F (36.4 C)-98.1 F (36.7 C)] 97.5 F (36.4 C) (05/29 2037) Pulse Rate:  [61] 61 (05/29 1739) Resp:  [18] 18 (05/29 1739) BP: (123-132)/(70-78) 123/70 (05/29 2037) SpO2:  [97 %-100 %] 100 % (05/29 1941) Weight:  [70.9 kg (156 lb 4.9 oz)-72.6 kg (160 lb)] 70.9 kg (156 lb 4.9 oz) (05/29 2037)  PHYSICAL EXAMINATION: General: well developed, well nourished female, NAD  Neuro: alert and oriented, follows commands  HEENT: supple, no JVD Cardiovascular: nsr, s1s2, no M/R/G Lungs: clear throughout, even, non labored  Abdomen: +BS x4, soft, non tender, non distended  Musculoskeletal: normal bulk and tone, no edema  Skin: right radial TR band in place no bleeding or hematoma   Recent Labs  Lab 08/10/17 1741  NA 140  K 3.5  CL 105  CO2 24  BUN 14  CREATININE 0.81  GLUCOSE 125*   Recent Labs  Lab 08/10/17 1741  HGB 13.9  HCT 40.5  WBC 8.3  PLT 261   Dg Chest 2 View  Result Date: 08/10/2017 CLINICAL DATA:  Short of breath and chest pain EXAM: CHEST - 2 VIEW COMPARISON:  None. FINDINGS: Normal heart size. Lungs clear. No pneumothorax. No pleural effusion. IMPRESSION: No active cardiopulmonary disease. Electronically Signed   By: Jolaine Click M.D.   On: 08/10/2017 19:13    ASSESSMENT / PLAN: NSTEMI  Hx: Obstructive Compulsive Disorder, Bipolar 1 Disorder, Major Depression, Anxiety, and PTSD P: Supplemental O2 for dyspnea and/or hypoxia  Continuous telemetry monitoring  Trend troponin's  Echo pending  Cardiology consulted appreciate input-cardiac medications per recommendations    Trend BMP  Replace electrolyte as indicated  Monitor UOP VTE px: SCD's  Trend CBC Monitor for s/sx of bleeding and transfuse for hgb <8 Continue outpatient effexor   Sonda Rumble, AGNP  Pulmonary/Critical Care Pager [phone] (please enter 7 digits) PCCM Consult Pager [phone] (please enter 7 digits)

## 2017-08-10 NOTE — Progress Notes (Signed)
Chaplain responded to a code stemi with intern. Chaplain team waited until nurse allowed team in. Chaplain found daughter  And grandchildren by bedside. Pt was anxious about procedure. Chaplain prayed for family unity and health. Also intern prayed for granddaughter's EOG's. Family left but will return later. Chaplain excused themselves and will be available when needed.   08/10/17 1900  Clinical Encounter Type  Visited With Patient and family together  Visit Type Initial;Spiritual support;Code  Referral From Nurse  Spiritual Encounters  Spiritual Needs Prayer

## 2017-08-10 NOTE — H&P (View-Only) (Signed)
Cardiology Consultation:   Patient ID: Brianna Mclean; [id number]; 11-14-1960   Admit date: 08/10/2017 Date of Consult: 08/10/2017  Primary Care Provider: Patient, No Pcp Per Primary Cardiologist: New (consult by Shaneal Barasch) Primary Electrophysiologist:  None   Patient Profile:   Brianna Mclean is a 57 y.o. female with a hx of anxiety/depression and PTSD, who is being seen today for the evaluation of chest pain and abnormal EKG at the request of Dr. Cyril Loosen.  History of Present Illness:   Brianna Mclean was in her usual state of health, driving home with her daughter from [place] to [place].  Around [place], she had acute onset of non-radiating substernal chest pain (10/10) in intensity, which persisted for several minutes despite taking ASA.  Her daughter pulled off [address] in [place] and called 911.  Upon EMS arrival, Ms. Schurman received SL NTG with gradual improvement in chest pain (currently 2/10).  Initial ED EKG's showed subtle inferior ST elevation not clearly meeting STEMI criteria.  She then converted into a wide-complex rhythm for several minutes before returning to sinus rhythm.  At this time, she continues to have mild chest discomfort without other symptoms.  She denies a history of prior cardiac disease, as well as hypertension, hyperlipidemia, diabetes mellitus, and kidney disease.  Past Medical History:  Diagnosis Date  . Anxiety   . Bipolar 1 disorder, depressed (HCC)   . Major depression   . Obsessive compulsive disorder   . PTSD (post-traumatic stress disorder)     Past Surgical History:  Procedure Laterality Date  . COSMETIC SURGERY       Home Medications:  Prior to Admission medications   Medication Sig Start Date Edyn Qazi Date Taking? Authorizing Provider  LORazepam (ATIVAN) 2 MG tablet Take 1 tablet by mouth 2 (two) times daily as needed. 12/17/16   [provider]  venlafaxine XR (EFFEXOR-XR) 75 MG 24 hr capsule Take 225 mg by mouth daily. With food 01/14/17    [provider]    Inpatient Medications: Scheduled Meds:  Continuous Infusions:  PRN Meds:   Allergies:    Allergies  Allergen Reactions  . Percocet [Oxycodone-Acetaminophen] Itching    Social History:   Patient denies current or prior tobacco use, as well as alcohol and drug use.   Family History:   Mother - stroke in her 11's   ROS:  Review of Systems  Unable to perform ROS: Acuity of condition    Physical Exam/Data:   Vitals:   08/10/17 1739 08/10/17 1740  BP: 132/78   Pulse: 61   Resp: 18   Temp: 98.1 F (36.7 C)   TempSrc: Oral   SpO2: 97%   Weight:  160 lb (72.6 kg)  Height:   (1.6 m)   No intake or output data in the 24 hours ending 08/10/17 1917 Filed Weights   08/10/17 1740  Weight: 160 lb (72.6 kg)   Body mass index is 28.34 kg/m.  General:  Well nourished, well developed, in no acute distress. HEENT: normal Lymph: no adenopathy Neck: no JVD Endocrine:  No thryomegaly Vascular: No carotid bruits; 2+ radial and pedal pulses. Cardiac:  normal S1, S2; RRR; no murmur or rubs Lungs:  clear to auscultation bilaterally, no wheezing, rhonchi or rales  Abd: soft, nontender, no hepatomegaly  Ext: no edema Musculoskeletal:  No deformities, BUE and BLE strength normal and equal Skin: warm and dry  Neuro:  CNs 2-12 intact, no focal abnormalities noted Psych:  Normal affect   EKG:  The  EKG was personally reviewed and demonstrates:  NSR with subtle inferior ST segment elevation (less than 1 mm).  Preceding tracing shows AIVR.  Relevant CV Studies: None  Laboratory Data:  Chemistry Recent Labs  Lab 08/10/17 1741  NA 140  K 3.5  CL 105  CO2 24  GLUCOSE 125*  BUN 14  CREATININE 0.81  CALCIUM 9.2  GFRNONAA >60  GFRAA >60  ANIONGAP 11    No results for input(s): PROT, ALBUMIN, AST, ALT, ALKPHOS, BILITOT in the last 168 hours. Hematology Recent Labs  Lab 08/10/17 1741  WBC 8.3  RBC 4.91  HGB 13.9  HCT 40.5  MCV 82.4    MCH 28.3  MCHC 34.4  RDW 14.4  PLT 261   Cardiac Enzymes Recent Labs  Lab 08/10/17 1741  TROPONINI <0.03   No results for input(s): TROPIPOC in the last 168 hours.  BNPNo results for input(s): BNP, PROBNP in the last 168 hours.  DDimer No results for input(s): DDIMER in the last 168 hours.  Radiology/Studies:  Dg Chest 2 View  Result Date: 08/10/2017 CLINICAL DATA:  Short of breath and chest pain EXAM: CHEST - 2 VIEW COMPARISON:  None. FINDINGS: Normal heart size. Lungs clear. No pneumothorax. No pleural effusion. IMPRESSION: No active cardiopulmonary disease. Electronically Signed   By: Jolaine Click M.D.   On: 08/10/2017 19:13    Assessment and Plan:   NSTEMI Patient's symptoms are concern for ACS, with EKG showing inferior ST elevation (though not diagnostic for STEMI).  However, in the setting of continued chest pain and wide-complex rhythm (may represent reperfusion rhythm following spontaneous recanalization of occluded coronary artery), we have agreed to proceed with emergent cardiac catheterization.  I have reviewed the risks, indications, and alternatives to cardiac catheterization, possible angioplasty, and stenting with the patient. Risks include but are not limited to bleeding, infection, vascular injury, stroke, myocardial infection, arrhythmia, kidney injury, radiation-related injury in the case of prolonged fluoroscopy use, emergency cardiac surgery, and death. The patient understands the risks of serious complication is 1-2 in 1000 with diagnostic cardiac cath and 1-2% or less with angioplasty/stenting.  Further recommendations to be made following catheterization.   For questions or updates, please contact [facility] Please consult [email or web address] for contact info under [facility].   Signed, Yvonne Kendall, MD  08/10/2017 7:17 PM

## 2017-08-10 NOTE — ED Provider Notes (Signed)
[facility] Emergency Department Provider Note   ____________________________________________    I have reviewed the triage vital signs and the nursing notes.   HISTORY  Chief Complaint Chest Pain     HPI Brianna Mclean is a 57 y.o. female who presents with complaints of chest pain.  Patient reports she [place] with her [place] and developed substernal severe pressure-like chest pain.  She has never had pain like this before.  No history of heart disease.  She does not smoke.  Denies shortness of breath.  No recent travel.  No calf pain or swelling.  Did not take anything for this.  EMS gave nitroglycerin which did help her pain   Past Medical History:  Diagnosis Date  . Anxiety   . Bipolar 1 disorder, depressed (HCC)   . Major depression   . Obsessive compulsive disorder   . PTSD (post-traumatic stress disorder)     Patient Active Problem List   Diagnosis Date Noted  . PTSD (post-traumatic stress disorder) 02/09/2017  . Major depression 02/09/2017    Past Surgical History:  Procedure Laterality Date  . COSMETIC SURGERY      Prior to Admission medications   Medication Sig Start Date End Date Taking? Authorizing Provider  aspirin 325 MG tablet Take 325 mg by mouth daily.   Yes [provider]  gabapentin (NEURONTIN) 600 MG tablet Take 600 mg by mouth 3 (three) times daily. 07/16/17  Yes [provider]  LORazepam (ATIVAN) 2 MG tablet Take 1 tablet by mouth 2 (two) times daily as needed. 12/17/16  Yes [provider]  venlafaxine XR (EFFEXOR-XR) 75 MG 24 hr capsule Take 225 mg by mouth daily. With food 01/14/17  Yes [provider]     Allergies Percocet [oxycodone-acetaminophen]  No family history on file.  Social History Social History   Tobacco Use  . Smoking status: Never Smoker  . Smokeless tobacco: Never Used  Substance Use Topics  . Alcohol use: No    Frequency: Never    . Drug use: No    Review of Systems  Constitutional: No fever/chills Eyes: No visual changes.  ENT: No sore throat. Cardiovascular: As above Respiratory: Denies shortness of breath. Gastrointestinal: No abdominal pain.  No nausea, no vomiting.   Genitourinary: Negative for dysuria. Musculoskeletal: Negative for back pain. Skin: Negative for rash. Neurological: Negative for headaches    ____________________________________________   PHYSICAL EXAM:  VITAL SIGNS: ED Triage Vitals  Enc Vitals Group     BP 08/10/17 1739 132/78     Pulse Rate 08/10/17 1739 61     Resp 08/10/17 1739 18     Temp 08/10/17 1739 98.1 F (36.7 C)     Temp Source 08/10/17 1739 Oral     SpO2 08/10/17 1739 97 %     Weight 08/10/17 1740 72.6 kg (160 lb)     Height 08/10/17 1740 1.6 m ( )     Head Circumference --      Peak Flow --      Pain Score 08/10/17 1739 7     Pain Loc --      Pain Edu? --      Excl. in GC? --     Constitutional: Alert and oriented. Pleasant and interactive Eyes: Conjunctivae are normal.   Nose: No congestion/rhinnorhea.  Cardiovascular: Normal rate, regular rhythm. Grossly normal heart sounds.  Good peripheral circulation. Respiratory: Normal respiratory effort.  No retractions. Lungs CTAB. Gastrointestinal:  Soft and nontender. No distention.  No CVA tenderness. Genitourinary: deferred Musculoskeletal:  Warm and well perfused Neurologic:  Normal speech and language. No gross focal neurologic deficits are appreciated.  Skin:  Skin is warm, dry and intact. No rash noted. Psychiatric: Mood and affect are normal. Speech and behavior are normal.  ____________________________________________   LABS (all labs ordered are listed, but only abnormal results are displayed)  Labs Reviewed  BASIC METABOLIC PANEL - Abnormal; Notable for the following components:      Result Value   Glucose, Bld 125 (*)    All other components within normal limits  CBC  TROPONIN I   APTT  PROTIME-INR  HEPARIN LEVEL (UNFRACTIONATED)  POC URINE PREG, ED   ____________________________________________  EKG  ED ECG REPORT I, Jene Every, the attending physician, personally viewed and interpreted this ECG.  Date: 08/10/2017  Rhythm: normal sinus rhythm QRS Axis: normal Intervals: normal ST/T Wave abnormalities: normal Narrative Interpretation: Small amount of elevation inferiorly, one box Repeat EKG ordered  ED ECG REPORT I, Jene Every, the attending physician, personally viewed and interpreted this ECG.  Date: 08/10/2017  Rhythm: normal sinus rhythm QRS Axis: normal Intervals: normal ST/T Wave abnormalities: normal Narrative Interpretation: Unchanged from initial  ED ECG REPORT I, Jene Every, the attending physician, personally viewed and interpreted this ECG.  Date: 08/10/2017  Rhythm: Idioventricular QRS Axis: normal Intervals: normal ST/T Wave abnormalities: Nonspecific changes Narrative Interpretation: Concerning given abrupt change in rhythm       ____________________________________________  RADIOLOGY Chest x-ray normal, no pneumothorax ____________________________________________   PROCEDURES  Procedure(s) performed: No  Procedures   Critical Care performed: yes  CRITICAL CARE Performed by: Jene Every   Total critical care time: 30 minutes  Critical care time was exclusive of separately billable procedures and treating other patients.  Critical care was necessary to treat or prevent imminent or life-threatening deterioration.  Critical care was time spent personally by me on the following activities: development of treatment plan with patient and/or surrogate as well as nursing, discussions with consultants, evaluation of patient's response to treatment, examination of patient, obtaining history from patient or surrogate, ordering and performing treatments and interventions, ordering and review of  laboratory studies, ordering and review of radiographic studies, pulse oximetry and re-evaluation of patient's condition.  ____________________________________________   INITIAL IMPRESSION / ASSESSMENT AND PLAN / ED COURSE  Pertinent labs & imaging results that were available during my care of the patient were reviewed by me and considered in my medical decision making (see chart for details).  Patient presents with chest pain, initial EKG very minimal elevation 2 3 aVF, will repeat and monitor carefully.  Patient reports her pain is improving after nitroglycerin.  Lab work is reassuring, including normal troponin  Second EKG unchanged  ----------------------------------------- 6:36 PM on 08/10/2017 ----------------------------------------- Patient had abrupt change noted by nurse on cardiac monitor.  EKG repeated and concerning for the development of new right bundle branch block  Discussed with Dr. Okey Dupre of cardiology who studied the EKGs and asked for an additional EKG. Concern regarding possible reperfusion change on third EKG. Dr. Okey Dupre recommended that we go ahead and call STEMI despite EKG being borderline     ____________________________________________   FINAL CLINICAL IMPRESSION(S) / ED DIAGNOSES  Final diagnoses:  ST elevation myocardial infarction (STEMI), unspecified artery [facility])        Note:  This document was prepared using Dragon voice recognition software and may include unintentional dictation errors.    Jene Every,  MD 08/10/17 1941

## 2017-08-10 NOTE — ED Notes (Signed)
Per Dr Cyril Loosen, repeat EKG in 15 minutes.

## 2017-08-10 NOTE — ED Notes (Signed)
Dr. End, Cardiologist at bedside at this time.  

## 2017-08-10 NOTE — Interval H&P Note (Signed)
History and Physical Interval Note:  08/10/2017 7:35 PM  Brianna Mclean  has presented today for cardiac catheterization, with the diagnosis of high-risk NSTEMI  The various methods of treatment have been discussed with the patient and family. After consideration of risks, benefits and other options for treatment, the patient has consented to  Procedure(s): Coronary/Graft Acute MI Revascularization (N/A) LEFT HEART CATH AND CORONARY ANGIOGRAPHY (N/A) as a surgical intervention .  The patient's history has been reviewed, patient examined, no change in status, stable for surgery.  I have reviewed the patient's chart and labs.  Questions were answered to the patient's satisfaction.    Cath Lab Visit (complete for each Cath Lab visit)  Clinical Evaluation Leading to the Procedure:   ACS: Yes.    Non-ACS:  N/A  Dequandre Cordova

## 2017-08-10 NOTE — Consult Note (Signed)
Cardiology Consultation:   Patient ID: Brianna Mclean; [id number]; 07/13/1960   Admit date: 08/10/2017 Date of Consult: 08/10/2017  Primary Care Provider: Patient, No Pcp Per Primary Cardiologist: New (consult by Kmarion Rawl) Primary Electrophysiologist:  None   Patient Profile:   Brianna Mclean is a 57 y.o. female with a hx of anxiety/depression and PTSD, who is being seen today for the evaluation of chest pain and abnormal EKG at the request of Dr. Kinner.  History of Present Illness:   Brianna Mclean was in her usual state of health, driving home with her daughter from  to [place].  Around [place], she had acute onset of non-radiating substernal chest pain (10/10) in intensity, which persisted for several minutes despite taking ASA.  Her daughter pulled off [address] in [place] and called 911.  Upon EMS arrival, Brianna Mclean received SL NTG with gradual improvement in chest pain (currently 2/10).  Initial ED EKG's showed subtle inferior ST elevation not clearly meeting STEMI criteria.  She then converted into a wide-complex rhythm for several minutes before returning to sinus rhythm.  At this time, she continues to have mild chest discomfort without other symptoms.  She denies a history of prior cardiac disease, as well as hypertension, hyperlipidemia, diabetes mellitus, and kidney disease.  Past Medical History:  Diagnosis Date  . Anxiety   . Bipolar 1 disorder, depressed (HCC)   . Major depression   . Obsessive compulsive disorder   . PTSD (post-traumatic stress disorder)     Past Surgical History:  Procedure Laterality Date  . COSMETIC SURGERY       Home Medications:  Prior to Admission medications   Medication Sig Start Date Laasya Peyton Date Taking? Authorizing Provider  LORazepam (ATIVAN) 2 MG tablet Take 1 tablet by mouth 2 (two) times daily as needed. 12/17/16   [provider]  venlafaxine XR (EFFEXOR-XR) 75 MG 24 hr capsule Take 225 mg by mouth daily. With food 01/14/17    [provider]    Inpatient Medications: Scheduled Meds:  Continuous Infusions:  PRN Meds:   Allergies:    Allergies  Allergen Reactions  . Percocet [Oxycodone-Acetaminophen] Itching    Social History:   Patient denies current or prior tobacco use, as well as alcohol and drug use.   Family History:   Mother - stroke in her 50's   ROS:  Review of Systems  Unable to perform ROS: Acuity of condition    Physical Exam/Data:   Vitals:   08/10/17 1739 08/10/17 1740  BP: 132/78   Pulse: 61   Resp: 18   Temp: 98.1 F (36.7 C)   TempSrc: Oral   SpO2: 97%   Weight:  160 lb (72.6 kg)  Height:  5' 3" (1.6 m)   No intake or output data in the 24 hours ending 08/10/17 1917 Filed Weights   08/10/17 1740  Weight: 160 lb (72.6 kg)   Body mass index is 28.34 kg/m.  General:  Well nourished, well developed, in no acute distress. HEENT: normal Lymph: no adenopathy Neck: no JVD Endocrine:  No thryomegaly Vascular: No carotid bruits; 2+ radial and pedal pulses. Cardiac:  normal S1, S2; RRR; no murmur or rubs Lungs:  clear to auscultation bilaterally, no wheezing, rhonchi or rales  Abd: soft, nontender, no hepatomegaly  Ext: no edema Musculoskeletal:  No deformities, BUE and BLE strength normal and equal Skin: warm and dry  Neuro:  CNs 2-12 intact, no focal abnormalities noted Psych:  Normal affect   EKG:  The   EKG was personally reviewed and demonstrates:  NSR with subtle inferior ST segment elevation (less than 1 mm).  Preceding tracing shows AIVR.  Relevant CV Studies: None  Laboratory Data:  Chemistry Recent Labs  Lab 08/10/17 1741  NA 140  K 3.5  CL 105  CO2 24  GLUCOSE 125*  BUN 14  CREATININE 0.81  CALCIUM 9.2  GFRNONAA >60  GFRAA >60  ANIONGAP 11    No results for input(s): PROT, ALBUMIN, AST, ALT, ALKPHOS, BILITOT in the last 168 hours. Hematology Recent Labs  Lab 08/10/17 1741  WBC 8.3  RBC 4.91  HGB 13.9  HCT 40.5  MCV 82.4    MCH 28.3  MCHC 34.4  RDW 14.4  PLT 261   Cardiac Enzymes Recent Labs  Lab 08/10/17 1741  TROPONINI <0.03   No results for input(s): TROPIPOC in the last 168 hours.  BNPNo results for input(s): BNP, PROBNP in the last 168 hours.  DDimer No results for input(s): DDIMER in the last 168 hours.  Radiology/Studies:  Dg Chest 2 View  Result Date: 08/10/2017 CLINICAL DATA:  Short of breath and chest pain EXAM: CHEST - 2 VIEW COMPARISON:  None. FINDINGS: Normal heart size. Lungs clear. No pneumothorax. No pleural effusion. IMPRESSION: No active cardiopulmonary disease. Electronically Signed   By: Jolaine Click M.D.   On: 08/10/2017 19:13    Assessment and Plan:   NSTEMI Patient's symptoms are concern for ACS, with EKG showing inferior ST elevation (though not diagnostic for STEMI).  However, in the setting of continued chest pain and wide-complex rhythm (may represent reperfusion rhythm following spontaneous recanalization of occluded coronary artery), we have agreed to proceed with emergent cardiac catheterization.  I have reviewed the risks, indications, and alternatives to cardiac catheterization, possible angioplasty, and stenting with the patient. Risks include but are not limited to bleeding, infection, vascular injury, stroke, myocardial infection, arrhythmia, kidney injury, radiation-related injury in the case of prolonged fluoroscopy use, emergency cardiac surgery, and death. The patient understands the risks of serious complication is 1-2 in 1000 with diagnostic cardiac cath and 1-2% or less with angioplasty/stenting.  Further recommendations to be made following catheterization.   For questions or updates, please contact [facility] Please consult [email or web address] for contact info under [facility].   Signed, Yvonne Kendall, MD  08/10/2017 7:17 PM

## 2017-08-10 NOTE — ED Notes (Signed)
Informed RN that patient has been roomed and is ready for evaluation.  Patient in NAD at this time and call bell placed within reach.   

## 2017-08-11 ENCOUNTER — Inpatient Hospital Stay (HOSPITAL_COMMUNITY)
Admit: 2017-08-11 | Discharge: 2017-08-11 | Disposition: A | Discharge status: Home / Self Care | Payer: Medicare PPO | Attending: Internal Medicine | Admitting: Internal Medicine

## 2017-08-11 ENCOUNTER — Encounter: Payer: Self-pay | Admitting: Internal Medicine

## 2017-08-11 DIAGNOSIS — I214 Non-ST elevation (NSTEMI) myocardial infarction: Principal | ICD-10-CM

## 2017-08-11 DIAGNOSIS — I2510 Atherosclerotic heart disease of native coronary artery without angina pectoris: Secondary | ICD-10-CM

## 2017-08-11 LAB — ECHOCARDIOGRAM COMPLETE
Height: 63 in
Weight: 2500.9 oz

## 2017-08-11 LAB — TROPONIN I
TROPONIN I: 12.41 ng/mL — AB (ref <0.03)
Troponin I: 16.55 ng/mL (ref <0.03)
Troponin I: 6.8 ng/mL (ref <0.03)

## 2017-08-11 LAB — HEMOGLOBIN A1C
Hgb A1c MFr Bld: 5.1 % (ref 4.8–5.6)
Mean Plasma Glucose: 99.67 mg/dL

## 2017-08-11 LAB — COMPREHENSIVE METABOLIC PANEL
ALBUMIN: 3.2 g/dL — AB (ref 3.5–5.0)
ALK PHOS: 69 U/L (ref 38–126)
ALT: 15 U/L (ref 14–54)
AST: 47 U/L — AB (ref 15–41)
Anion gap: 7 (ref 5–15)
BILIRUBIN TOTAL: 0.7 mg/dL (ref 0.3–1.2)
BUN: 11 mg/dL (ref 6–20)
CALCIUM: 8.5 mg/dL — AB (ref 8.9–10.3)
CO2: 25 mmol/L (ref 22–32)
Chloride: 109 mmol/L (ref 101–111)
Creatinine, Ser: 0.78 mg/dL (ref 0.44–1.00)
GFR calc Af Amer: >60 mL/min (ref >60)
GFR calc non Af Amer: >60 mL/min (ref >60)
GLUCOSE: 98 mg/dL (ref 65–99)
Potassium: 3.6 mmol/L (ref 3.5–5.1)
SODIUM: 141 mmol/L (ref 135–145)
Total Protein: 6.3 g/dL — ABNORMAL LOW (ref 6.5–8.1)

## 2017-08-11 LAB — LIPID PANEL
CHOLESTEROL: 248 mg/dL — AB (ref 0–200)
HDL: 40 mg/dL — ABNORMAL LOW (ref >40)
LDL Cholesterol: 191 mg/dL — ABNORMAL HIGH (ref 0–99)
TRIGLYCERIDES: 84 mg/dL (ref <150)
Total CHOL/HDL Ratio: 6.2 RATIO
VLDL: 17 mg/dL (ref 0–40)

## 2017-08-11 LAB — URINE DRUG SCREEN, QUALITATIVE (ARMC ONLY)
AMPHETAMINES, UR SCREEN: NOT DETECTED
BENZODIAZEPINE, UR SCRN: POSITIVE — AB
Barbiturates, Ur Screen: NOT DETECTED
Cannabinoid 50 Ng, Ur ~~LOC~~: NOT DETECTED
Cocaine Metabolite,Ur ~~LOC~~: NOT DETECTED
MDMA (ECSTASY) UR SCREEN: NOT DETECTED
METHADONE SCREEN, URINE: NOT DETECTED
OPIATE, UR SCREEN: NOT DETECTED
PHENCYCLIDINE (PCP) UR S: NOT DETECTED
Tricyclic, Ur Screen: NOT DETECTED

## 2017-08-11 LAB — CBC
HCT: 39 % (ref 35.0–47.0)
HEMOGLOBIN: 13.4 g/dL (ref 12.0–16.0)
MCH: 28.4 pg (ref 26.0–34.0)
MCHC: 34.4 g/dL (ref 32.0–36.0)
MCV: 82.5 fL (ref 80.0–100.0)
Platelets: 221 10*3/uL (ref 150–440)
RBC: 4.72 MIL/uL (ref 3.80–5.20)
RDW: 14.8 % — ABNORMAL HIGH (ref 11.5–14.5)
WBC: 7.2 10*3/uL (ref 3.6–11.0)

## 2017-08-11 MED ORDER — MUPIROCIN 2 % EX OINT
1.0000 "application " | TOPICAL_OINTMENT | Freq: Two times a day (BID) | CUTANEOUS | Status: DC
  Administered 2017-08-11 – 2017-08-12 (×2): 1 via NASAL
  Filled 2017-08-11 (×2): qty 22

## 2017-08-11 MED ORDER — CHLORHEXIDINE GLUCONATE CLOTH 2 % EX PADS
6.0000 | MEDICATED_PAD | Freq: Every day | CUTANEOUS | Status: DC
  Administered 2017-08-12: 6 via TOPICAL

## 2017-08-11 NOTE — Progress Notes (Signed)
   Name: Brianna Mclean MRN: [id number] DOB: 1961-02-01     CONSULTATION DATE: 08/10/2017  Patient underwent cardiac cath yesterday reported as no obstructive coronary artery disease, no chest pain, has been tolerating medical management.  PAST MEDICAL HISTORY :   has a past medical history of Anxiety, Bipolar 1 disorder, depressed (HCC), Major depression, Obsessive compulsive disorder, and PTSD (post-traumatic stress disorder).  has a past surgical history that includes Cosmetic surgery and LEFT HEART CATH AND CORONARY ANGIOGRAPHY (N/A, 08/10/2017). Prior to Admission medications   Medication Sig Start Date End Date Taking? Authorizing Provider  aspirin 325 MG tablet Take 325 mg by mouth daily.   Yes [provider]  gabapentin (NEURONTIN) 600 MG tablet Take 600 mg by mouth 3 (three) times daily. 07/16/17  Yes [provider]  LORazepam (ATIVAN) 2 MG tablet Take 1 tablet by mouth 2 (two) times daily as needed. 12/17/16  Yes [provider]  venlafaxine XR (EFFEXOR-XR) 75 MG 24 hr capsule Take 225 mg by mouth daily. With food 01/14/17  Yes [provider]   Allergies  Allergen Reactions  . Percocet [Oxycodone-Acetaminophen] Itching    FAMILY HISTORY:  family history includes Anxiety disorder in her mother; Depression in her father; Hyperlipidemia in her mother; Stroke in her mother. SOCIAL HISTORY:  reports that she has never smoked. She has never used smokeless tobacco. She reports that she does not drink alcohol or use drugs.  REVIEW OF SYSTEMS:   Unable to obtain due to critical illness   VITAL SIGNS: Temp:  [97.5 F (36.4 C)-98.6 F (37 C)] 98.6 F (37 C) (05/30 0700) Pulse Rate:  [53-70] 68 (05/30 0900) Resp:  [9-19] 16 (05/30 1400) BP: (117-149)/(70-98) 117/84 (05/30 1400) SpO2:  [97 %-100 %] 100 % (05/30 0900) Weight:  [70.9 kg (156 lb 4.9 oz)-72.6 kg (160 lb)] 70.9 kg (156 lb 4.9 oz) (05/29 2037)  Physical Examination:  Awake and  oriented with no acute neurological deficits Tolerating room air, no distress, able to talk in full sentences, bilateral equal air entry and no adventitious sounds. S1 & S2 are audible with no murmur Benign abdomen with normal peristalsis Normal extremities and no peripheral edema  ASSESSMENT / PLAN:  NSTEMI.  No evidence of obstructive coronary artery disease on cardiac cath.  Echo on 08/11/2017.  LVEF 55 to 60% and no regional wall motion abnormalities. -Medical management as per cardiology.  Aspirin + Statin + carvedilol.  Depression anxiety -Optimize antipsychotics and continue with supportive care  Full code

## 2017-08-11 NOTE — Progress Notes (Signed)
[employer] [facility] [place] [facility]   PATIENT NAME: Brianna Mclean    MR#:  [id number]  DATE OF BIRTH:  January 15, 1961  SUBJECTIVE:  CHIEF COMPLAINT:   Chief Complaint  Patient presents with  . Chest Pain  Case discussed with intensivist, awaiting cardiology approval for transfer to regular nursing floor, cardiology note reviewed  REVIEW OF SYSTEMS:  CONSTITUTIONAL: No fever, fatigue or weakness.  EYES: No blurred or double vision.  EARS, NOSE, AND THROAT: No tinnitus or ear pain.  RESPIRATORY: No cough, shortness of breath, wheezing or hemoptysis.  CARDIOVASCULAR: No chest pain, orthopnea, edema.  GASTROINTESTINAL: No nausea, vomiting, diarrhea or abdominal pain.  GENITOURINARY: No dysuria, hematuria.  ENDOCRINE: No polyuria, nocturia,  HEMATOLOGY: No anemia, easy bruising or bleeding SKIN: No rash or lesion. MUSCULOSKELETAL: No joint pain or arthritis.   NEUROLOGIC: No tingling, numbness, weakness.  PSYCHIATRY: No anxiety or depression.   ROS  DRUG ALLERGIES:   Allergies  Allergen Reactions  . Percocet [Oxycodone-Acetaminophen] Itching    VITALS:  Blood pressure 119/77, pulse 68, temperature (P) 98.6 F (37 C), temperature source (P) Axillary, resp. rate 19, height  (1.6 m), weight 70.9 kg (156 lb 4.9 oz), SpO2 100 %.  PHYSICAL EXAMINATION:  GENERAL:  57 y.o.-year-old patient lying in the bed with no acute distress.  EYES: Pupils equal, round, reactive to light and accommodation. No scleral icterus. Extraocular muscles intact.  HEENT: Head atraumatic, normocephalic. Oropharynx and nasopharynx clear.  NECK:  Supple, no jugular venous distention. No thyroid enlargement, no tenderness.  LUNGS: Normal breath sounds bilaterally, no wheezing, rales,rhonchi or crepitation. No use of accessory muscles of respiration.  CARDIOVASCULAR: S1, S2 normal. No murmurs, rubs, or gallops.  ABDOMEN: Soft, nontender, nondistended. Bowel sounds present. No organomegaly  or mass.  EXTREMITIES: No pedal edema, cyanosis, or clubbing.  NEUROLOGIC: Cranial nerves II through XII are intact. Muscle strength 5/5 in all extremities. Sensation intact. Gait not checked.  PSYCHIATRIC: The patient is alert and oriented x 3.  SKIN: No obvious rash, lesion, or ulcer.   Physical Exam LABORATORY PANEL:   CBC Recent Labs  Lab 08/11/17 0448  WBC 7.2  HGB 13.4  HCT 39.0  PLT 221   ------------------------------------------------------------------------------------------------------------------  Chemistries  Recent Labs  Lab 08/11/17 0448  NA 141  K 3.6  CL 109  CO2 25  GLUCOSE 98  BUN 11  CREATININE 0.78  CALCIUM 8.5*  AST 47*  ALT 15  ALKPHOS 69  BILITOT 0.7   ------------------------------------------------------------------------------------------------------------------  Cardiac Enzymes Recent Labs  Lab 08/11/17 0448 08/11/17 1052  TROPONINI 12.41* 6.80*   ------------------------------------------------------------------------------------------------------------------  RADIOLOGY:  Dg Chest 2 View  Result Date: 08/10/2017 CLINICAL DATA:  Short of breath and chest pain EXAM: CHEST - 2 VIEW COMPARISON:  None. FINDINGS: Normal heart size. Lungs clear. No pneumothorax. No pleural effusion. IMPRESSION: No active cardiopulmonary disease. Electronically Signed   By: Jolaine Click M.D.   On: 08/10/2017 19:13    ASSESSMENT AND PLAN:  *Acute non-STEMI Status post heart catheterization Aug 10, 2016 by Dr. Okey Dupre Continue aspirin, statin therapy, Coreg, nitroglycerin as needed, acute coronary syndrome protocol  *Chronic depression Stable Continue Effexor  *Chronic general anxiety disorder, unspecified Continue Ativan as needed   All the records are reviewed and case discussed with Care Management/Social Workerr. Management plans discussed with the patient, family and they are in agreement.  CODE STATUS: full  TOTAL TIME TAKING CARE OF THIS  PATIENT: 35 minutes.     POSSIBLE  D/C IN 1-2 DAYS, DEPENDING ON CLINICAL CONDITION.   Evelena Asa Salary M.D on 08/11/2017   Between 7am to 6pm - Pager - [phone]  After 6pm go to [email or web address] - password [email or web address]  [facility]  Office  [phone]  CC: Primary care physician; Patient, No Pcp Per  Note: This dictation was prepared with Dragon dictation along with smaller phrase technology. Any transcriptional errors that result from this process are unintentional.

## 2017-08-11 NOTE — Progress Notes (Signed)
Progress Note  Patient Name: Brianna Mclean Date of Encounter: 08/11/2017  Primary Cardiologist: new (Dr. Okey Dupre)  Subjective   She feels well this morning with no chest pain or shortness.   Inpatient Medications    Scheduled Meds: . aspirin  81 mg Oral Daily  . atorvastatin  40 mg Oral q1800  . carvedilol  3.125 mg Oral BID WC  . gabapentin  600 mg Oral TID  . sodium chloride flush  3 mL Intravenous Q12H  . venlafaxine XR  225 mg Oral Q breakfast   Continuous Infusions: . sodium chloride     PRN Meds: sodium chloride, acetaminophen, LORazepam, nitroGLYCERIN, ondansetron (ZOFRAN) IV, sodium chloride flush   Vital Signs    Vitals:   08/11/17 0300 08/11/17 0400 08/11/17 0500 08/11/17 0813  BP: 121/79 131/73 128/76 131/76  Pulse: (!) 59 (!) 53 (!) 58 70  Resp: Temp:  98.3 F (36.8 C)    TempSrc:  Oral    SpO2: 98% 100% 99%   Weight:      Height:        Intake/Output Summary (Last 24 hours) at 08/11/2017 0956 Last data filed at 08/11/2017 0500 Gross per 24 hour  Intake 856.25 ml  Output -  Net 856.25 ml   Filed Weights   08/10/17 1740 08/10/17 2037  Weight: 160 lb (72.6 kg) 156 lb 4.9 oz (70.9 kg)    Telemetry    Normal sinus rhythm. One 3 beat run of nonsustained ventricular tachycardia- Personally Reviewed  ECG    Sinus bradycardia with no significant ST or T wave changes.- Personally Reviewed  Physical Exam  . GEN: No acute distress.   Neck: No JVD Cardiac: RRR, no murmurs, rubs, or gallops.  Respiratory: Clear to auscultation bilaterally. GI: Soft, nontender, non-distended  MS: No edema; No deformity. Neuro:  Nonfocal  Psych: Normal affect  Right radial pulses normal with no hematoma.  Labs    Chemistry Recent Labs  Lab 08/10/17 1741 08/11/17 0448  NA 140 141  K 3.5 3.6  CL 105 109  CO2 24 25  GLUCOSE 125* 98  BUN 14 11  CREATININE 0.81 0.78  CALCIUM 9.2 8.5*  PROT  --  6.3*  ALBUMIN  --  3.2*  AST  --  47*  ALT   --  15  ALKPHOS  --  69  BILITOT  --  0.7  GFRNONAA >60 >60  GFRAA >60 >60  ANIONGAP 11 7     Hematology Recent Labs  Lab 08/10/17 1741 08/11/17 0448  WBC 8.3 7.2  RBC 4.91 4.72  HGB 13.9 13.4  HCT 40.5 39.0  MCV 82.4 82.5  MCH 28.3 28.4  MCHC 34.4 34.4  RDW 14.4 14.8*  PLT 261 221    Cardiac Enzymes Recent Labs  Lab 08/10/17 1741 08/11/17 0000 08/11/17 0448  TROPONINI <0.03 16.55* 12.41*   No results for input(s): TROPIPOC in the last 168 hours.   BNPNo results for input(s): BNP, PROBNP in the last 168 hours.   DDimer No results for input(s): DDIMER in the last 168 hours.   Radiology    Dg Chest 2 View  Result Date: 08/10/2017 CLINICAL DATA:  Short of breath and chest pain EXAM: CHEST - 2 VIEW COMPARISON:  None. FINDINGS: Normal heart size. Lungs clear. No pneumothorax. No pleural effusion. IMPRESSION: No active cardiopulmonary disease. Electronically Signed   By: Jolaine Click M.D.   On: 08/10/2017 19:13    Cardiac  Studies   Echocardiogram was being done at the bedside and was personally reviewed by me.  It showed normal LV systolic function with no significant valvular abnormalities.  Patient Profile     57 y.o. female with no significant past medical problems who presented with non-ST elevation myocardial infarction.  Assessment & Plan    1.  Non-ST elevation myocardial infarction: I reviewed cardiac catheterization images with Dr. Okey Dupre.  There was no evidence of obstructive coronary artery disease.  There was a 50 to 60% stenosis and OM 2 with an appearance suggestive of spontaneous coronary dissection versus plaque rupture with recanalization. Recommend medical therapy for this with aspirin and atorvastatin.  Continue small dose carvedilol.  2.  Hyperlipidemia: Not LDL was significantly elevated at 191 with a triglyceride of 248.  Continue treatment with atorvastatin.  If the patient remains stable, she can be transferred to 2 AM the afternoon.  For  questions or updates, please contact [facility] Please consult [email or web address] for contact info under Cardiology/STEMI.      Signed, Lorine Bears, MD  08/11/2017, 9:56 AM

## 2017-08-11 NOTE — Progress Notes (Signed)
Report given to Tammy, RN for patient to be transferred to room 242 with personal belongings and pt stated she would update her family of new room number.

## 2017-08-11 NOTE — Progress Notes (Signed)
*  PRELIMINARY RESULTS* Echocardiogram 2D Echocardiogram has been performed.  Brianna Mclean 08/11/2017, 9:41 AM

## 2017-08-12 ENCOUNTER — Telehealth: Payer: Self-pay | Admitting: *Deleted

## 2017-08-12 MED ORDER — CARVEDILOL 3.125 MG PO TABS: 3.1250 mg | ORAL_TABLET | Freq: Two times a day (BID) | ORAL | 0 refills | Status: DC

## 2017-08-12 MED ORDER — ATORVASTATIN CALCIUM 40 MG PO TABS: 40.0000 mg | ORAL_TABLET | Freq: Every day | ORAL | 0 refills | Status: DC

## 2017-08-12 NOTE — Progress Notes (Signed)
Pt ambulated at length in hallway. Tolerated it well. Weaned to room. Plan for discharge this afternoon

## 2017-08-12 NOTE — Care Management (Signed)
Informed by attending that patient is to discharge home today. Says that patient has issues with transportation.  CM spoke with patient and provided information on community resources for transportation.  Patient stated she only has issues for transportation home but just thought of a neighbor that can transport patient home.

## 2017-08-12 NOTE — Progress Notes (Signed)
Progress Note  Patient Name: Brianna Mclean Date of Encounter: 08/12/2017  Primary Cardiologist: new (Dr. Okey Dupre)  Subjective   No chest pain or shortness of breath.  She complains of low-grade fever, headache and feeling tired.  It feels like a viral illness.   Inpatient Medications    Scheduled Meds: . aspirin  81 mg Oral Daily  . atorvastatin  40 mg Oral q1800  . carvedilol  3.125 mg Oral BID WC  . Chlorhexidine Gluconate Cloth  6 each Topical Q0600  . gabapentin  600 mg Oral TID  . mupirocin ointment  1 application Nasal BID  . sodium chloride flush  3 mL Intravenous Q12H  . venlafaxine XR  225 mg Oral Q breakfast   Continuous Infusions: . sodium chloride     PRN Meds: sodium chloride, acetaminophen, LORazepam, nitroGLYCERIN, ondansetron (ZOFRAN) IV, sodium chloride flush   Vital Signs    Vitals:   08/11/17 1700 08/11/17 1823 08/11/17 1943 08/12/17 0503  BP: 125/74 121/69 115/79 116/71  Pulse: 60 63 68 65  Resp: (!) Temp:  98.6 F (37 C) 98.7 F (37.1 C) 99.1 F (37.3 C)  TempSrc:  Oral Oral Oral  SpO2: 100% 100% 99% 97%  Weight:      Height:        Intake/Output Summary (Last 24 hours) at 08/12/2017 0823 Last data filed at 08/11/2017 2200 Gross per 24 hour  Intake 78 ml  Output 275 ml  Net -197 ml   Filed Weights   08/10/17 1740 08/10/17 2037  Weight: 160 lb (72.6 kg) 156 lb 4.9 oz (70.9 kg)    Telemetry    Normal sinus rhythm. One 3 beat run of nonsustained ventricular tachycardia- Personally Reviewed  ECG    Sinus bradycardia with no significant ST or T wave changes.- Personally Reviewed  Physical Exam  . GEN: No acute distress.   Neck: No JVD Cardiac: RRR, no murmurs, rubs, or gallops.  Respiratory: Clear to auscultation bilaterally. GI: Soft, nontender, non-distended  MS: No edema; No deformity. Neuro:  Nonfocal  Psych: Normal affect  Right radial pulses normal with no hematoma.  Labs    Chemistry Recent Labs  Lab  08/10/17 1741 08/11/17 0448  NA 140 141  K 3.5 3.6  CL 105 109  CO2 24 25  GLUCOSE 125* 98  BUN 14 11  CREATININE 0.81 0.78  CALCIUM 9.2 8.5*  PROT  --  6.3*  ALBUMIN  --  3.2*  AST  --  47*  ALT  --  15  ALKPHOS  --  69  BILITOT  --  0.7  GFRNONAA >60 >60  GFRAA >60 >60  ANIONGAP 11 7     Hematology Recent Labs  Lab 08/10/17 1741 08/11/17 0448  WBC 8.3 7.2  RBC 4.91 4.72  HGB 13.9 13.4  HCT 40.5 39.0  MCV 82.4 82.5  MCH 28.3 28.4  MCHC 34.4 34.4  RDW 14.4 14.8*  PLT 261 221    Cardiac Enzymes Recent Labs  Lab 08/10/17 1741 08/11/17 0000 08/11/17 0448 08/11/17 1052  TROPONINI <0.03 16.55* 12.41* 6.80*   No results for input(s): TROPIPOC in the last 168 hours.   BNPNo results for input(s): BNP, PROBNP in the last 168 hours.   DDimer No results for input(s): DDIMER in the last 168 hours.   Radiology    Dg Chest 2 View  Result Date: 08/10/2017 CLINICAL DATA:  Short of breath and chest pain EXAM: CHEST -  2 VIEW COMPARISON:  None. FINDINGS: Normal heart size. Lungs clear. No pneumothorax. No pleural effusion. IMPRESSION: No active cardiopulmonary disease. Electronically Signed   By: Jolaine Click M.D.   On: 08/10/2017 19:13    Cardiac Studies   Echocardiogram was being done at the bedside and was personally reviewed by me.  It showed normal LV systolic function with no significant valvular abnormalities.  Patient Profile     57 y.o. female with no significant past medical problems who presented with non-ST elevation myocardial infarction.  Assessment & Plan    1.  Non-ST elevation myocardial infarction: Due to suspected spontaneous coronary artery dissection involving OM 2 with no obstructive disease.  LV systolic function remains normal and she appears to be stable from a cardiac standpoint. Recommend medical therapy for this with aspirin, carvedilol and atorvastatin.   2.  Hyperlipidemia: LDL was significantly elevated at 191 with a triglyceride of  248.  Continue treatment with atorvastatin.  The patient complains of a viral illness symptoms.  She is not febrile.  Monitor closely and ambulate.  She is stable for discharge from a cardiac standpoint if no other issues as long as she is able to ambulate with no symptoms.  We will arrange for follow-up in our office in 1 to 2 weeks.  For questions or updates, please contact [facility] Please consult [email or web address] for contact info under Cardiology/STEMI.      Signed, Lorine Bears, MD  08/12/2017, 8:23 AM

## 2017-08-12 NOTE — Telephone Encounter (Signed)
-----   Message from Coralee Rud sent at 08/12/2017 10:49 AM EDT ----- Regarding: tcm/ph 6/20 2:30 ryan Shea Evans, PA

## 2017-08-12 NOTE — Telephone Encounter (Signed)
Patient currently admitted at this time. 

## 2017-08-12 NOTE — Plan of Care (Signed)
  Problem: Clinical Measurements: Goal: Ability to maintain clinical measurements within normal limits will improve Outcome: Progressing Goal: Will remain free from infection Outcome: Progressing Goal: Diagnostic test results will improve Outcome: Progressing Goal: Respiratory complications will improve Outcome: Progressing Goal: Cardiovascular complication will be avoided Outcome: Progressing   Problem: Pain Managment: Goal: General experience of comfort will improve Outcome: Progressing   Problem: Safety: Goal: Ability to remain free from injury will improve Outcome: Progressing   Problem: Skin Integrity: Goal: Risk for impaired skin integrity will decrease Outcome: Progressing   No complaints of pain throughout the shift, right radial cath site at a level 0, transparent dressing still on. Educated pt on + MRSA PCR, put order set in for positive MRSA PCR standing orders.

## 2017-08-12 NOTE — Care Management Important Message (Signed)
Important Message  Patient Details  Name: Cassell SmilesFredrica Augustus MRN: [id number] Date of Birth: 03/29/1960   Medicare Important Message Given:  N/A - LOS <3 / Initial given by admissions    Eber HongGreene, Agness Sibrian R, RN 08/12/2017, 11:53 AM

## 2017-08-12 NOTE — Telephone Encounter (Signed)
Patient's appointment is actually with Dr End on 08/23/17 at 3:40 pm.

## 2017-08-12 NOTE — Discharge Summary (Signed)
[facility] [place] [facility]   PATIENT NAME: Brianna Mclean    MR#:  [id number]  DATE OF BIRTH:  12/10/1960  DATE OF ADMISSION:  08/10/2017 ADMITTING PHYSICIAN: Yvonne Kendallhristopher End, MD  DATE OF DISCHARGE: No discharge date for patient encounter.  PRIMARY CARE PHYSICIAN: Patient, No Pcp Per    ADMISSION DIAGNOSIS:  ST elevation myocardial infarction (STEMI), unspecified artery (HCC) [I21.3] NSTEMI (non-ST elevated myocardial infarction) (HCC) [I21.4]  DISCHARGE DIAGNOSIS:  Principal Problem:   Non-ST elevation (NSTEMI) myocardial infarction [facility](HCC) Active Problems:   Major depression   Anxiety   SECONDARY DIAGNOSIS:   Past Medical History:  Diagnosis Date  . Anxiety   . Bipolar 1 disorder, depressed (HCC)   . Major depression   . Obsessive compulsive disorder   . PTSD (post-traumatic stress disorder)     HOSPITAL COURSE:  *Acute non-STEMI S/p heart catheterization Aug 10, 2016 by Dr. Okey DupreEnd and found to have suspected spontaneous coronary artery dissection involving OM 2 with no obstructive disease Treated with aspirin, statin therapy, Coreg, nitroglycerin as needed, and patient did well  *Chronic depression Stable Continued Effexor  *Chronic general anxiety disorder, unspecified Stable Provided Ativan as needed  DISCHARGE CONDITIONS:   Stable  CONSULTS OBTAINED:  Treatment Team:  Yvonne KendallEnd, Christopher, MD  DRUG ALLERGIES:   Allergies  Allergen Reactions  . Percocet [Oxycodone-Acetaminophen] Itching    DISCHARGE MEDICATIONS:   Allergies as of 08/12/2017      Reactions   Percocet [oxycodone-acetaminophen] Itching      Medication List    TAKE these medications   aspirin 325 MG tablet Take 325 mg by mouth daily.   atorvastatin 40 MG tablet Commonly known as:  LIPITOR Take 1 tablet (40 mg total) by mouth daily at 6 PM.   carvedilol 3.125 MG tablet Commonly known as:  COREG Take 1 tablet (3.125 mg total) by mouth 2 (two)  times daily with a meal.   gabapentin 600 MG tablet Commonly known as:  NEURONTIN Take 600 mg by mouth 3 (three) times daily.   LORazepam 2 MG tablet Commonly known as:  ATIVAN Take 1 tablet by mouth 2 (two) times daily as needed.   venlafaxine XR 75 MG 24 hr capsule Commonly known as:  EFFEXOR-XR Take 225 mg by mouth daily. With food        DISCHARGE INSTRUCTIONS:  If you experience worsening of your admission symptoms, develop shortness of breath, life threatening emergency, suicidal or homicidal thoughts you must seek medical attention immediately by calling 911 or calling your MD immediately  if symptoms less severe.  You Must read complete instructions/literature along with all the possible adverse reactions/side effects for all the Medicines you take and that have been prescribed to you. Take any new Medicines after you have completely understood and accept all the possible adverse reactions/side effects.   Please note  You were cared for by a hospitalist during your hospital stay. If you have any questions about your discharge medications or the care you received while you were in the hospital after you are discharged, you can call the unit and asked to speak with the hospitalist on call if the hospitalist that took care of you is not available. Once you are discharged, your primary care physician will handle any further medical issues. Please note that NO REFILLS for any discharge medications will be authorized once you are discharged, as it is imperative that you return to your primary care physician (or establish a relationship  with a primary care physician if you do not have one) for your aftercare needs so that they can reassess your need for medications and monitor your lab values.    Today   CHIEF COMPLAINT:   Chief Complaint  Patient presents with  . Chest Pain    HISTORY OF PRESENT ILLNESS:  57 y.o. female who presents with chest pain.  Patient states started  this afternoon, and that it was "not a panic attack".  She came to the ED for evaluation, and here had an initial negative troponin, but did have some EKG changes.  While in the ED her rhythm changed into wide-complex, and she had persistent symptoms.  Despite negative troponin she was taken to Cath Lab by cardiology and found to have possible dissection -OM 2.  Patient was stable and pain-free after cath, she was moved to the ICU and hospitalist were called.  VITAL SIGNS:  Blood pressure 112/73, pulse 65, temperature 98.5 F (36.9 C), temperature source Oral, resp. rate 16, height 5\' 3"  (1.6 m), weight 70.9 kg (156 lb 4.9 oz), SpO2 95 %.  I/O:    Intake/Output Summary (Last 24 hours) at 08/12/2017 1024 Last data filed at 08/11/2017 2200 Gross per 24 hour  Intake 78 ml  Output 275 ml  Net -197 ml    PHYSICAL EXAMINATION:  GENERAL:  57 y.o.-year-old patient lying in the bed with no acute distress.  EYES: Pupils equal, round, reactive to light and accommodation. No scleral icterus. Extraocular muscles intact.  HEENT: Head atraumatic, normocephalic. Oropharynx and nasopharynx clear.  NECK:  Supple, no jugular venous distention. No thyroid enlargement, no tenderness.  LUNGS: Normal breath sounds bilaterally, no wheezing, rales,rhonchi or crepitation. No use of accessory muscles of respiration.  CARDIOVASCULAR: S1, S2 normal. No murmurs, rubs, or gallops.  ABDOMEN: Soft, non-tender, non-distended. Bowel sounds present. No organomegaly or mass.  EXTREMITIES: No pedal edema, cyanosis, or clubbing.  NEUROLOGIC: Cranial nerves II through XII are intact. Muscle strength 5/5 in all extremities. Sensation intact. Gait not checked.  PSYCHIATRIC: The patient is alert and oriented x 3.  SKIN: No obvious rash, lesion, or ulcer.   DATA REVIEW:   CBC Recent Labs  Lab 08/11/17 0448  WBC 7.2  HGB 13.4  HCT 39.0  PLT 221    Chemistries  Recent Labs  Lab 08/11/17 0448  NA 141  K 3.6  CL 109   CO2 25  GLUCOSE 98  BUN 11  CREATININE 0.78  CALCIUM 8.5*  AST 47*  ALT 15  ALKPHOS 69  BILITOT 0.7    Cardiac Enzymes Recent Labs  Lab 08/11/17 1052  TROPONINI 6.80*    Microbiology Results  Results for orders placed or performed during the hospital encounter of 08/10/17  MRSA PCR Screening     Status: Abnormal   Collection Time: 08/10/17  8:47 PM  Result Value Ref Range Status   MRSA by PCR POSITIVE (A) NEGATIVE Final    Comment:        The GeneXpert MRSA Assay (FDA approved for NASAL specimens only), is one component of a comprehensive MRSA colonization surveillance program. It is not intended to diagnose MRSA infection nor to guide or monitor treatment for MRSA infections. Performed at [facility], [address] [place], [place] [address]     RADIOLOGY:  Dg Chest 2 View  Result Date: 08/10/2017 CLINICAL DATA:  Short of breath and chest pain EXAM: CHEST - 2 VIEW COMPARISON:  None. FINDINGS: Normal heart size.  Lungs clear. No pneumothorax. No pleural effusion. IMPRESSION: No active cardiopulmonary disease. Electronically Signed   By: Jolaine Click M.D.   On: 08/10/2017 19:13    EKG:   Orders placed or performed during the hospital encounter of 08/10/17  . ED EKG within 10 minutes  . ED EKG within 10 minutes  . EKG 12-Lead  . EKG 12-Lead  . EKG 12-Lead  . EKG 12-Lead  . ED EKG within 10 minutes  . ED EKG within 10 minutes  . EKG 12-Lead  . EKG 12-Lead  . EKG 12-Lead  . EKG 12-Lead  . EKG 12-Lead  . EKG 12-Lead  . EKG 12-Lead  . EKG 12-Lead  . EKG 12-Lead  . EKG 12-Lead  . EKG 12-Lead  . EKG 12-Lead      Management plans discussed with the patient, family and they are in agreement.  CODE STATUS:     Code Status Orders  (From admission, onward)        Start     Ordered   08/10/17 2037  Full code  Continuous     08/10/17 2036    Code Status History    This patient has a current code status but no historical code status.       TOTAL TIME TAKING CARE OF THIS PATIENT: 45 minutes.    Evelena Asa Zaliah Wissner M.D on 08/12/2017 at 10:24 AM  Between 7am to 6pm - Pager - [phone]  After 6pm go to [email or web address] - password [email or web address]  [facility]  Office  [phone]  CC: Primary care physician; Patient, No Pcp Per   Note: This dictation was prepared with Dragon dictation along with smaller phrase technology. Any transcriptional errors that result from this process are unintentional.

## 2017-08-12 NOTE — Progress Notes (Signed)
Pt to be discharged today. Iv's  And tele removed.discharge instructions and prescrips given to pt to her understanding. Disposable scrubs provided. Awaiting transportation.

## 2017-08-12 NOTE — Progress Notes (Addendum)
Cardiovascular and Pulmonary Nurse Navigator Note:    57 year old female with hx of anxiety, bipolar disorder, depression, OCD, PTSD who presented to the ED with chest pain / NSTEMI / HLD.    Patient underwent cardiac cath which revealed NSTEMI was due to suspected spontaneous CAD involving OM2 with no obstructive disease.  EF 55-60% per echo performed on 08/11/2017.   Patient discharging on the following medications:   ASA, Coreg, Lipitor.    Education:   "Heart Attack Bouncing Back" booklet given and reviewed with patient. Discussed the definition of spontaneous coronary artery dissection.    Discussed modifiable risk factors including controlling blood pressure, cholesterol, and blood sugar; following heart healthy diet; maintaining healthy weight; exercise; and smoking cessation, if applicable. ?Note: Patient is a NEVER smoker.??? ? Discussed cardiac medications including rationale for taking, mechanisms of action, and side effects. Stressed the importance of taking medications as prescribed.  ? Discussed emergency plan for heart attack symptoms. Patient verbalized understanding of need to call 911 and not to drive herself?or have her family drive her?to the?ER if having cardiac symptoms / chest pain.  ? Heart healthy diet of low sodium, low fat, low cholesterol discussed. Information on diet provided. ? ? Smoking Cessation - Patient is a never smoker.   ? Exercise - Benefits of exercised discussed. Informed patient cardiologist has referred her to outpatient Cardiac Rehab. An overview of the program was provided. Brochure, informational letter, class and orientation times, and CPT billing codes given to patient.?Patient expressed interest in participating in Cardiac Rehab, but will need to figure out transportation.  CM RN provided patient with info for assistance with transportation.  ? Patient appreciative of this information.  ? Army Meliaiane Rilya Longo, RN, BSN, [facility]? [facility]  [facility] Cardiac  &?Pulmonary Rehab  Cardiovascular &?Pulmonary Nurse Navigator  Direct Line: [phone]  Department Phone #: [phone] Fax: [phone]? Email Address: [email or web address] ?

## 2017-08-15 ENCOUNTER — Telehealth: Payer: Self-pay

## 2017-08-15 NOTE — Telephone Encounter (Signed)
Flagged on EMMI report for having other questions/problems.  Called and spoke with patient.  She mentioned she doesn't have any questions or issues at this time and that she had a very nice stay at [facility].  I thanked her for her feedback and informed her that she would receive one more automated call in the next few days as a final check-in.

## 2017-08-15 NOTE — Telephone Encounter (Signed)
Patient [facility] on 08/12/17.   Patient understands to follow up with provider ? On 08/23/17 at 3:40pm at [place].  Patient understands discharge instructions? Yes Patient understands medications and regiment? Yes  Patient understands to bring all medications to this visit? Yes

## 2017-08-19 ENCOUNTER — Telehealth: Payer: Self-pay | Admitting: Licensed Clinical Social Worker

## 2017-08-19 NOTE — Telephone Encounter (Signed)
CSW attempted to speak with patient, and she said she did not want to talk with CSW.  Patient then hung up on CSW, CSW to sign off.  Ervin KnackEric R. Monty Spicher, MSW, Theresia MajorsLCSWA [phone]  08/19/2017 4:52 PM

## 2017-08-23 ENCOUNTER — Encounter: Payer: Self-pay | Admitting: Internal Medicine

## 2017-08-23 ENCOUNTER — Ambulatory Visit (INDEPENDENT_AMBULATORY_CARE_PROVIDER_SITE_OTHER): Payer: Medicare PPO | Admitting: Internal Medicine

## 2017-08-23 VITALS — BP 120/90 | HR 65 | Ht 63.5 in | Wt 157.0 lb

## 2017-08-23 DIAGNOSIS — I214 Non-ST elevation (NSTEMI) myocardial infarction: Secondary | ICD-10-CM | POA: Diagnosis not present

## 2017-08-23 DIAGNOSIS — E782 Mixed hyperlipidemia: Secondary | ICD-10-CM

## 2017-08-23 DIAGNOSIS — I2542 Coronary artery dissection: Secondary | ICD-10-CM | POA: Diagnosis not present

## 2017-08-23 MED ORDER — ATORVASTATIN CALCIUM 20 MG PO TABS: 20.0000 mg | ORAL_TABLET | Freq: Every day | ORAL | 3 refills | Status: DC

## 2017-08-23 MED ORDER — ASPIRIN EC 81 MG PO TBEC: 81.0000 mg | DELAYED_RELEASE_TABLET | Freq: Every day | ORAL | 3 refills | Status: DC

## 2017-08-23 NOTE — Progress Notes (Signed)
Follow-up Outpatient Visit Date: 08/23/2017  Primary Care Provider: Patient, No Pcp Per No address on file  Chief Complaint: Follow-up NSTEMI  HPI:  [name] is a 57 y.o. 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  [name] presented to the [facility] ED with chest pain on 08/10/2017.  EKGs showed subtle inferior ST segment changes not diagnostic for STEMI.  However, given persistent chest pain, she was taken for emergent cardiac catheterization that revealed 6 to 60% narrowing in OM2 suggestive of spontaneous coronary artery dissection with spontaneous recanalization.  No other significant CAD was identified.  The patient was treated medically.  Since returning home, she has minimized her activity.  She has not had any further anginal chest pain but notes muscular soreness along the anterior chest wall as well as in her shoulders and arms.  She also feels tired/fatigued since leaving the hospital.  She denies shortness of breath, palpitations, lightheadedness, orthopnea, PND, and bleeding.  She is compliant with her medications.  --------------------------------------------------------------------------------------------------  Past Medical History:  Diagnosis Date  . Anxiety   . Bipolar 1 disorder, depressed (HCC)   . Hyperlipidemia   . Major depression   . 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 Obsessive compulsive disorder   . PTSD (post-traumatic stress disorder)    Past Surgical History:  Procedure Laterality Date  . COSMETIC SURGERY    . LEFT HEART CATH AND CORONARY ANGIOGRAPHY N/A 08/10/2017   Procedure: LEFT HEART CATH AND CORONARY ANGIOGRAPHY;  Surgeon: Yvonne KendallEnd, Ranell Finelli, MD;  Location: [facility] INVASIVE CV LAB;   Service: Cardiovascular;  Laterality: N/A;   Current Meds  Medication Sig  . aspirin 325 MG tablet Take 325 mg by mouth daily.  Marland Kitchen. atorvastatin (LIPITOR) 40 MG tablet Take 1 tablet (40 mg total) by mouth daily at 6 PM.  . carvedilol (COREG) 3.125 MG tablet Take 1 tablet (3.125 mg total) by mouth 2 (two) times daily with a meal.  . gabapentin (NEURONTIN) 600 MG tablet Take 600 mg by mouth 3 (three) times daily.  Marland Kitchen. LORazepam (ATIVAN) 2 MG tablet Take 1 tablet by mouth 2 (two) times daily as needed.  . venlafaxine XR (EFFEXOR-XR) 75 MG 24 hr capsule Take 225 mg by mouth daily. With food    Allergies: Percocet [oxycodone-acetaminophen]  Social History   Tobacco Use  . Smoking status: Never Smoker  . Smokeless tobacco: Never Used  Substance Use Topics  . Alcohol use: No    Frequency: Never  . Drug use: No    Family History  Problem Relation Age of Onset  . Anxiety disorder Mother   . Hyperlipidemia Mother   . Stroke Mother   . Depression Father     Review of Systems: A 12-system review of systems was performed and was negative except as noted in the HPI.  --------------------------------------------------------------------------------------------------  Physical Exam: BP 120/90 (BP Location: Left Arm, Patient Position: Sitting, Cuff Size: Normal)   Pulse 65   Ht 5' 3.5" (1.613 m)   Wt 157 lb (71.2 kg)   BMI 27.38 kg/m  Repeat BP 130/84  General: NAD.  Appears slightly anxious. HEENT: No conjunctival pallor or scleral icterus. Moist mucous membranes.  OP clear. Neck: Supple without lymphadenopathy, thyromegaly, JVD, or HJR. Lungs: Normal work of breathing. Clear to  auscultation bilaterally without wheezes or crackles. Heart: Regular rate and rhythm without murmurs, rubs, or gallops. Non-displaced PMI. Abd: Bowel sounds present. Soft, NT/ND without hepatosplenomegaly Ext: No lower extremity edema. Radial, PT, and DP pulses are 2+ bilaterally.  Right radial arteriotomy  site well-healed. Skin: Warm and dry without rash.  EKG: Normal sinus rhythm with nonspecific ST/T changes.  Lab Results  Component Value Date   WBC 7.2 08/11/2017   HGB 13.4 08/11/2017   HCT 39.0 08/11/2017   MCV 82.5 08/11/2017   PLT 221 08/11/2017    Lab Results  Component Value Date   NA 141 08/11/2017   K 3.6 08/11/2017   CL 109 08/11/2017   CO2 25 08/11/2017   BUN 11 08/11/2017   CREATININE 0.78 08/11/2017   GLUCOSE 98 08/11/2017   ALT 15 08/11/2017    Lab Results  Component Value Date   CHOL 248 (H) 08/11/2017   HDL 40 (L) 08/11/2017   LDLCALC 191 (H) 08/11/2017   TRIG 84 08/11/2017   CHOLHDL 6.2 08/11/2017    --------------------------------------------------------------------------------------------------  ASSESSMENT AND PLAN: NSTEMI secondary to SCAD No further angina since leaving the hospital.  Vague chest wall discomfort also involving the shoulders and upper arms is nonspecific.  Query if this is related to statin therapy.  We will decrease atorvastatin to 20 mg daily.  I would like to continue carvedilol if possible for blood pressure and heart rate control.  However, if fatigue persists, we may need to consider discontinuing this in favor of an alternative beta-blocker or a calcium channel blocker.  We discussed the natural history of spontaneous coronary artery dissection and have agreed to obtain a renal artery Doppler to exclude fibromuscular dysplasia.  I think it is fine for Ms. Rumsey to begin participation in cardiac rehab.  I have asked her to decrease aspirin to 81 mg daily.  Hyperlipidemia LDL noted to be quite elevated at the time of MI (191).  Patient was discharged on atorvastatin 40 mg daily.  However, given myalgias involving the chest wall and arms, we will just decrease this to 20 mg daily.  Plan for repeat lipid panel in 2 to 3 months.  Follow-up: Return to clinic in 1 month.  Yvonne Kendall, MD 08/23/2017 4:05 PM

## 2017-08-23 NOTE — Patient Instructions (Signed)
Medication Instructions:  Your physician has recommended you make the following change in your medication:  1- DECREASE Aspirin to 81 mg by mouth once a day after you have finished your current supply of the higher dose. 2- DECREASE Atorvastatin to 20 mg by  Mouth once a day.   Labwork: NONE  Testing/Procedures: Your physician has requested that you have a renal artery duplex. During this test, an ultrasound is used to evaluate blood flow to the kidneys. Allow one hour for this exam. Do not eat after midnight the day before and avoid carbonated beverages. Take your medications as you usually do.    Follow-Up: Your physician recommends that you schedule a follow-up appointment in: 1 MONTH WITH DR END OR APP.  If you need a refill on your cardiac medications before your next appointment, please call your pharmacy.

## 2017-08-24 ENCOUNTER — Encounter: Payer: Self-pay | Admitting: Internal Medicine

## 2017-08-24 DIAGNOSIS — I2542 Coronary artery dissection: Secondary | ICD-10-CM | POA: Insufficient documentation

## 2017-08-24 DIAGNOSIS — E782 Mixed hyperlipidemia: Secondary | ICD-10-CM | POA: Insufficient documentation

## 2017-09-01 ENCOUNTER — Ambulatory Visit: Payer: Medicare PPO | Admitting: Physician Assistant

## 2017-09-01 ENCOUNTER — Ambulatory Visit (INDEPENDENT_AMBULATORY_CARE_PROVIDER_SITE_OTHER): Payer: Medicare PPO

## 2017-09-01 DIAGNOSIS — I2542 Coronary artery dissection: Secondary | ICD-10-CM | POA: Diagnosis not present

## 2017-09-13 ENCOUNTER — Telehealth: Payer: Self-pay | Admitting: Internal Medicine

## 2017-09-13 ENCOUNTER — Other Ambulatory Visit: Payer: Self-pay | Admitting: Internal Medicine

## 2017-09-13 MED ORDER — CARVEDILOL 3.125 MG PO TABS: 3.1250 mg | ORAL_TABLET | Freq: Two times a day (BID) | ORAL | 1 refills | Status: DC

## 2017-09-13 NOTE — Telephone Encounter (Signed)
°*  STAT* If patient is at the pharmacy, call can be transferred to refill team.   1. Which medications need to be refilled? (please list name of each medication and dose if known) carvedilol (COREG) 3.125 MG tablet - 1 tablet 2 times daily  2. Which pharmacy/location (including street and city if local pharmacy) is medication to be sent to? [facility] in [place]  3. Do they need a 30 day or 90 day supply? 30 day - her insurance will not allow 90 day

## 2017-09-23 NOTE — Progress Notes (Signed)
Cardiology Office Note Date:  09/26/2017  Patient ID:  Brianna Mclean, DOB 02/22/1961, MRN [id number] PCP:  Rayetta HumphreyGeorge, Sionne A, MD  Cardiologist:  Dr. Okey DupreEnd, MD    Chief Complaint: Follow up  History of Present Illness: Brianna SmilesFredrica Mclean is a 57 y.o. female with history of CAD with NSTEMI secondary to SCAD on OM branch in 07/2017, HLD, anxiety, depression, and PTSD who presents for follow up of her CAD.   Patient was admitted to the hospital in 07/2017 with chest pain. EKG showed subtle inferior ST segment changes not diagnostic for STEMI. She underwent emergent LHC given persistent chest pain that revealed a narrowing of OM2 suggestive of SCAD with spontaneous recanalization. No other significant CAD was noted. She was managed medically. In follow up on 08/23/2017, she was doing well and had not had any further anginal chest pain, though did note some soreness along her anterior chest wall and shoulders. She was also fatigued, though was fairly sedentary. Her Lipitor was decreased to 20 mg daily. It was preferred that she remain on Coreg, if possible. She underwent renal artery ultrasound on 09/01/2017 given her SCAD to exclude FMD, which was negative.   She comes in today continuing to note significant fatigue.  She has continued to minimize her activity.  She has not had any further anginal pain though does continue to note some mild shortness of breath that is not associated with exertion.  She has been compliant with aspirin, Lipitor, and carvedilol.  She also mentions right lower extremity muscle soreness and wonders if she has PAD after reading the posterior in the office.  Patient's muscle soreness is associated with rest and not exertion.  She also notes cool feet.  She denies any chest pain, palpitations, lightheadedness, orthopnea, PND, or bleeding.  Not checking her blood pressure or weight at home.  She indicates that she knows that she is quite anxious following her recent admission.  Past  Medical History:  Diagnosis Date  . Anxiety   . Bipolar 1 disorder, depressed (HCC)   . Hyperlipidemia   . Major depression   . 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 Obsessive compulsive disorder   . PTSD (post-traumatic stress disorder)     Past Surgical History:  Procedure Laterality Date  . COSMETIC SURGERY    . LEFT HEART CATH AND CORONARY ANGIOGRAPHY N/A 08/10/2017   Procedure: LEFT HEART CATH AND CORONARY ANGIOGRAPHY;  Surgeon: Yvonne KendallEnd, Christopher, MD;  Location: [facility] INVASIVE CV LAB;  Service: Cardiovascular;  Laterality: N/A;    Current Meds  Medication Sig  . aspirin EC 81 MG tablet Take 1 tablet (81 mg total) by mouth daily.  Marland Kitchen. atorvastatin (LIPITOR) 20 MG tablet Take 1 tablet (20 mg total) by mouth daily.  Marland Kitchen. gabapentin (NEURONTIN) 600 MG tablet Take 600 mg by mouth 3 (three) times daily.  Marland Kitchen. LORazepam (ATIVAN) 2 MG tablet Take 1 tablet by mouth 2 (two) times daily as needed.  . venlafaxine XR (EFFEXOR-XR) 75 MG 24 hr capsule Take 225 mg by mouth daily. With food  . [DISCONTINUED] carvedilol (COREG) 3.125 MG tablet TAKE 1 TABLET(3.125 MG) BY MOUTH TWICE DAILY WITH A MEAL    Allergies:   Percocet [oxycodone-acetaminophen]   Social History:  The patient  reports that she has never smoked. She has never used smokeless tobacco. She reports that she does not drink alcohol or use drugs.   Family History:  The patient's family history includes Anxiety disorder in her mother; Depression in her father; Hyperlipidemia in her mother; Stroke in her mother.  ROS:   Review of Systems  Constitutional: Positive for malaise/fatigue. Negative for chills, diaphoresis, fever and weight loss.  HENT: Negative for congestion.   Eyes: Negative for discharge and redness.  Respiratory: Positive for shortness of breath. Negative for cough, hemoptysis, sputum production and wheezing.   Cardiovascular:  Negative for chest pain, palpitations, orthopnea, claudication, leg swelling and PND.  Gastrointestinal: Negative for abdominal pain, blood in stool, heartburn, melena, nausea and vomiting.  Genitourinary: Negative for hematuria.  Musculoskeletal: Positive for myalgias. Negative for falls.  Skin: Negative for rash.  Neurological: Positive for weakness. Negative for dizziness, tingling, tremors, sensory change, speech change, focal weakness and loss of consciousness.  Endo/Heme/Allergies: Does not bruise/bleed easily.  Psychiatric/Behavioral: Negative for substance abuse. The patient is nervous/anxious.   All other systems reviewed and are negative.    PHYSICAL EXAM:  VS:  BP 120/82 (BP Location: Left Arm, Patient Position: Sitting, Cuff Size: Normal)   Pulse 67   Ht 5' 3.5" (1.613 m)   Wt 156 lb (70.8 kg)   BMI 27.20 kg/m  BMI: Body mass index is 27.2 kg/m.  Physical Exam  Constitutional: She is oriented to person, place, and time. She appears well-developed and well-nourished.  HENT:  Head: Normocephalic and atraumatic.  Eyes: Right eye exhibits no discharge. Left eye exhibits no discharge.  Neck: Normal range of motion. No JVD present.  Cardiovascular: Normal rate, regular rhythm, S1 normal, S2 normal and normal heart sounds. Exam reveals no distant heart sounds, no friction rub, no midsystolic click and no opening snap.  No murmur heard. Pulses:      Dorsalis pedis pulses are 1+ on the right side, and 1+ on the left side.       Posterior tibial pulses are 1+ on the right side, and 1+ on the left side.  Pulmonary/Chest: Effort normal and breath sounds normal. No respiratory distress. She has no decreased breath sounds. She has no wheezes. She has no rales. She exhibits no tenderness.  Abdominal: Soft. She exhibits no distension. There is no tenderness.  Musculoskeletal: She exhibits no edema.  Neurological: She is alert and oriented to person, place, and time.  Skin: Skin is warm  and dry. No cyanosis. Nails show no clubbing.  Psychiatric: She has a normal mood and affect. Her speech is normal and behavior is normal. Judgment and thought content normal.     EKG:  Was not ordered today.  Recent Labs: 08/11/2017: ALT 15; BUN 11; Creatinine, Ser 0.78; Hemoglobin 13.4; Platelets 221; Potassium 3.6; Sodium 141  08/11/2017: Cholesterol 248; HDL 40; LDL Cholesterol 191; Total CHOL/HDL Ratio 6.2; Triglycerides 84; VLDL 17   CrCl cannot be calculated (Patient's most recent lab result is older than the maximum 21 days allowed.).   Wt Readings from Last 3 Encounters:  09/26/17 156 lb (70.8 kg)  08/23/17 157 lb (71.2 kg)  08/10/17 156 lb 4.9 oz (70.9 kg)     Other studies reviewed: Additional studies/records reviewed today include: summarized above  ASSESSMENT AND PLAN:  1. Non-STEMI secondary to SCAD: No further angina since leaving the hospital.  Renal artery ultrasound was negative for FMD.  Continue aspirin 81 mg daily, atorvastatin, and now amlodipine as below.  She is welcome to participate in cardiac rehab if she would like.  2. Dyspnea/fatigue: She notes a significant amount of fatigue since being placed on  carvedilol.  Because of this, we have agreed to transition her from carvedilol to amlodipine 5 mg daily.  Check a BNP.  Suspect there is a fair component of anxiety and physical deconditioning.  3. Right lower extremity cramping: Symptoms are somewhat atypical for claudication however the patient does have diminished bilateral lower extremity pulses.  Schedule lower extremity arterial ultrasound/ABI.  If this is unrevealing recommend she follow-up with PCP.  4. Hyperlipidemia: LDL was quite elevated at the time of her MI at 191.  She was previously on Lipitor 40 mill grams daily though this was decreased to 20 mg daily at her hospital follow-up in 08/2017 secondary to possible myalgias.  Patient prefers to remain on Lipitor 20 mg daily at this time.  We are checking  a CK today given her lower extremity cramping.  If this is normal, plan to repeat lipid and liver function in 1 to 2 months.  5. Anxiety: Likely playing a significant role in the above.  Per PCP.  Disposition: F/u with Dr. Okey Dupre in 4 weeks.  Current medicines are reviewed at length with the patient today.  The patient did not have any concerns regarding medicines.  Signed, Eula Listen, PA-C 09/26/2017 5:16 PM     [facility] [place] [address] [place], [place] [address] [phone]

## 2017-09-26 ENCOUNTER — Ambulatory Visit (INDEPENDENT_AMBULATORY_CARE_PROVIDER_SITE_OTHER): Payer: Medicare PPO | Admitting: Physician Assistant

## 2017-09-26 ENCOUNTER — Encounter: Payer: Self-pay | Admitting: Physician Assistant

## 2017-09-26 ENCOUNTER — Other Ambulatory Visit
Admission: RE | Admit: 2017-09-26 | Discharge: 2017-09-26 | Disposition: A | Discharge status: Home / Self Care | Payer: Medicare PPO | Source: Ambulatory Visit | Attending: Physician Assistant | Admitting: Physician Assistant

## 2017-09-26 VITALS — BP 120/82 | HR 67 | Ht 63.5 in | Wt 156.0 lb

## 2017-09-26 DIAGNOSIS — I214 Non-ST elevation (NSTEMI) myocardial infarction: Secondary | ICD-10-CM | POA: Insufficient documentation

## 2017-09-26 DIAGNOSIS — Z79899 Other long term (current) drug therapy: Secondary | ICD-10-CM | POA: Diagnosis present

## 2017-09-26 DIAGNOSIS — I2510 Atherosclerotic heart disease of native coronary artery without angina pectoris: Secondary | ICD-10-CM | POA: Diagnosis present

## 2017-09-26 DIAGNOSIS — E785 Hyperlipidemia, unspecified: Secondary | ICD-10-CM | POA: Diagnosis not present

## 2017-09-26 DIAGNOSIS — R252 Cramp and spasm: Secondary | ICD-10-CM | POA: Diagnosis not present

## 2017-09-26 DIAGNOSIS — I2542 Coronary artery dissection: Secondary | ICD-10-CM | POA: Diagnosis not present

## 2017-09-26 DIAGNOSIS — F419 Anxiety disorder, unspecified: Secondary | ICD-10-CM

## 2017-09-26 LAB — BASIC METABOLIC PANEL
ANION GAP: 8 (ref 5–15)
BUN: 11 mg/dL (ref 6–20)
CALCIUM: 9.2 mg/dL (ref 8.9–10.3)
CO2: 29 mmol/L (ref 22–32)
Chloride: 105 mmol/L (ref 98–111)
Creatinine, Ser: 0.84 mg/dL (ref 0.44–1.00)
GFR calc Af Amer: >60 mL/min (ref >60)
GLUCOSE: 100 mg/dL — AB (ref 70–99)
Potassium: 4.5 mmol/L (ref 3.5–5.1)
SODIUM: 142 mmol/L (ref 135–145)

## 2017-09-26 LAB — MAGNESIUM: Magnesium: 2.4 mg/dL (ref 1.7–2.4)

## 2017-09-26 LAB — CK: CK TOTAL: 60 U/L (ref 38–234)

## 2017-09-26 LAB — BRAIN NATRIURETIC PEPTIDE: B NATRIURETIC PEPTIDE 5: 21 pg/mL (ref 0.0–100.0)

## 2017-09-26 MED ORDER — AMLODIPINE BESYLATE 5 MG PO TABS: 5.0000 mg | ORAL_TABLET | Freq: Every day | ORAL | 3 refills | Status: DC

## 2017-09-26 NOTE — Patient Instructions (Addendum)
Medication Instructions:  Your physician has recommended you make the following change in your medication:  1- STOP Carvedilol. 2- START Amlodipine 5 mg by mouth once a day.   Labwork: Your physician recommends that you return for lab work in: TODAY (BMP, BNP, CK, Mg). Please go to the [facility]. You will check in at the front desk to the right as you walk into the atrium. Valet Parking is offered if needed.    Testing/Procedures: Your physician has requested that you have a lower extremity arterial doppler- During this test, ultrasound is used to evaluate arterial blood flow in the legs. Allow approximately one hour for this exam.   Your physician has requested that you have an ankle brachial index (ABI). During this test an ultrasound and blood pressure cuff are used to evaluate the arteries that supply the arms and legs with blood. Allow thirty minutes for this exam. There are no restrictions or special instructions.    Follow-Up: Your physician recommends that you schedule a follow-up appointment in: 1 MONTH WITH RYAN OR DR END.  If you need a refill on your cardiac medications before your next appointment, please call your pharmacy.   Ankle-Brachial Index Test The ankle-brachial index (ABI) test is used to find peripheral vascular disease (PVD). PVD is also known as peripheral arterial disease (PAD). PVD is the blocking or hardening of the arteries anywhere within the circulatory system beyond the heart. PVD is caused by cholesterol deposits in your blood vessels (atherosclerosis). These deposits cause arteries to narrow. The delivery of oxygen to your tissues is impaired as a result. This can cause muscle pain and fatigue. This is called claudication. PVD means there may also be buildup of cholesterol in your:  Heart. This increases the risk of heart attacks.  Brain. This increases the risk of strokes.  The ankle-brachial index test measures the blood flow in your arms  and legs. This test also determines if blood vessels in your leg are narrowed by cholesterol deposits. There are additional causes of a reduced ankle-brachial index, such as inflammation of vessels or a clot in the vessels. However, these are much less common than narrowing due to cholesterol deposits. What is being tested? The test is done while you are lying down and resting. Measurements are taken of the systolic pressure:  In your arm (brachial).  In your ankle at several points along your leg.  Systolic pressure is the pressure inside your arteries when your heart pumps. The measurements are taken several times on both sides. Then, the highest systolic pressure of the ankle is divided by the highest brachial systolic pressure. The result is the ankle-brachial pressure ratio, or ABI. Sometimes this test is repeated after you have exercised on a treadmill for five minutes. You may have leg pain during the exercise portion of the test if you suffer from PAD. If the index number drops after exercise, this may show that PAD is present. A normal ABI ratio is between 0.9 and 1.4. A value below 0.9 is considered abnormal. This information is not intended to replace advice given to you by your health care provider. Make sure you discuss any questions you have with your health care provider. Document Released: 03/05/2004 Document Revised: 08/07/2015 Document Reviewed: 10/05/2013 Elsevier Interactive Patient Education  [employer]2018 [employer]

## 2017-10-05 ENCOUNTER — Other Ambulatory Visit: Payer: Self-pay | Admitting: Physician Assistant

## 2017-10-05 DIAGNOSIS — R252 Cramp and spasm: Secondary | ICD-10-CM

## 2017-10-05 DIAGNOSIS — R0989 Other specified symptoms and signs involving the circulatory and respiratory systems: Secondary | ICD-10-CM

## 2017-10-14 ENCOUNTER — Ambulatory Visit (INDEPENDENT_AMBULATORY_CARE_PROVIDER_SITE_OTHER): Payer: Medicare PPO

## 2017-10-14 DIAGNOSIS — R0989 Other specified symptoms and signs involving the circulatory and respiratory systems: Secondary | ICD-10-CM | POA: Diagnosis not present

## 2017-10-14 DIAGNOSIS — R252 Cramp and spasm: Secondary | ICD-10-CM

## 2017-11-08 ENCOUNTER — Ambulatory Visit: Payer: Medicare PPO | Admitting: Physician Assistant

## 2017-12-26 NOTE — Telephone Encounter (Signed)
CSW left message awaiting call back from patient to discuss how her hospital stay was and psychosocial lost of interest follow up.  Ervin Knack. Rebekah Sprinkle, MSW, [employer] [phone]

## 2018-09-18 ENCOUNTER — Telehealth: Payer: Self-pay | Admitting: Internal Medicine

## 2018-09-18 NOTE — Telephone Encounter (Signed)
Callback pool to double check with requesting provider if this clearance is to keep a record on the earliest date patient can potentially come off of antiplatelet therapy or is there a specific procedure already in mind. Patient's last was in 07/2017, therefore she is more than 1 year out form surgery. Depend on the type of the procedure, holding antiplatelet will need to be cleared by the primary cardiologist. She does not have prior valve procedure, therefore no need for SBE prophylaxis.

## 2018-09-18 NOTE — Telephone Encounter (Signed)
   [facility] Pre-operative Risk Assessment    Request for surgical clearance:  What type of surgery is being performed?  Dental : cleaning, extractions, fillings When is this surgery scheduled?  TBD  1. What type of clearance is required (medical clearance vs. Pharmacy clearance to hold med vs. Both)?  Not listed  Are there any medications that need to be held prior to surgery and how long? Not listed Name of physician performing surgery? [facility]  What is your office phone number [phone]   7.   What is your office fax number [phone] 8.   Anesthesia type (None, local, MAC, general) ? Local with epinephrine   Lucienne Minks 09/18/2018, 4:05 PM  _________________________________________________________________   (provider comments below)

## 2018-09-19 NOTE — Telephone Encounter (Signed)
Called the requesting office of [facility] and spoke with Jodie. Patient will be having a deep cleaning, 3 teeth extractions and fillings TBD. The requesting office is asking for medical and pharmacy clearance. Patient is taking ASA 81 mg daily, how long to hold.

## 2018-09-20 NOTE — Telephone Encounter (Signed)
Dr  End, is it ok to temporarily hold aspirin for 5 days prior for teeth extraction. Patient is >1 year out from possible SCAD. Although cath report suggested indefinite aspirin, no intervention was attempted at the time given TIMI-3 flow and resolution of chest pain.

## 2018-09-21 NOTE — Telephone Encounter (Signed)
It looks like the patient has not been seen in our office for >1 year; she would need a visit in order to provide formal clearance.  While I would prefer indefinite aspirin, if it must be held (which seems unusual for a dental procedure), it can be stopped 5 days prior to the procedure.  Nelva Bush, MD [facility] Pager: [phone]

## 2018-09-21 NOTE — Telephone Encounter (Signed)
   Primary Cardiologist:No primary care provider on file.  Chart reviewed as part of pre-operative protocol coverage. Because of Brianna Mclean's past medical history and time since last visit, he/she will require a follow-up visit in order to better assess preoperative cardiovascular risk.  Pre-op covering staff: - Please schedule appointment and call patient to inform them. - Please contact requesting surgeon's office via preferred method (i.e, phone, fax) to inform them of need for appointment prior to surgery.  If applicable, this message will also be routed to pharmacy pool and/or primary cardiologist for input on holding anticoagulant/antiplatelet agent as requested below so that this information is available at time of patient's appointment.   [place], [place]  09/21/2018, 10:29 AM

## 2018-09-21 NOTE — Telephone Encounter (Signed)
Left a detailed message for the patient letting her know that she needs to get scheduled for an appointment for clearance. Asked that she give the office a call back and someone will be glad to assist her.

## 2018-10-08 NOTE — Progress Notes (Signed)
Office Visit    Patient Name: Brianna SmilesFredrica Peach Date of Encounter: 10/10/2018  Primary Care Provider:  Rayetta HumphreyGeorge, Sionne A, MD Primary Cardiologist:  Yvonne Kendallhristopher End, MD  Chief Complaint    58 yo female with PMH CAD, HLD presents for preprocedural cardiac evaluation for dental work.   Past Medical History    Past Medical History:  Diagnosis Date  . Anxiety   . Bipolar 1 disorder, depressed (HCC)   . Coronary artery disease    a. 07/2017 cath 60% stenosis OM2 likely scad, medical management.  . Hyperlipidemia   . Major depression   . 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 Obsessive compulsive disorder   . PTSD (post-traumatic stress disorder)    Past Surgical History:  Procedure Laterality Date  . CESAREAN SECTION    . COSMETIC SURGERY    . LEFT HEART CATH AND CORONARY ANGIOGRAPHY N/A 08/10/2017   Procedure: LEFT HEART CATH AND CORONARY ANGIOGRAPHY;  Surgeon: Yvonne KendallEnd, Christopher, MD;  Location: [facility];  Service: Cardiovascular;  Laterality: N/A;    Allergies  Allergies  Allergen Reactions  . Percocet [Oxycodone-Acetaminophen] Itching    History of Present Illness    Brianna Mclean is a 58yo female with cardiac history notable for CAD with NSTEMI secondary to scad of the OM branch 07/2017, HLD. Additional PMH includes PTSD, anxiety, depression. She was last seen by Eula Listenyan Dunn, PA 09/26/17. She presents today for pre procedural cardiac evaluation for dental work.   Admitted 07/2017 with chest pain. EKG with subtle inferior ST changes nondiagnostic for STEMI. Emergent LHC due to persistent chest pain showing narrowing OM2 suggestive of scad with spontaneous recanalization. No other significant CAD. Managed medically, recommende aspirin 81mg  indefinitely. Renal artery US 08/2017 given scad negative for FMD. In follow up she reports continued fatigue without anginal pain, mild SOB  not associated with exertion. Lipitor decreased from 40mg  to 20mg  secondary to myalgias; noted LE soreness with rest along with cool feet. She was transitioned from carvedilol to amlodipine for fatigue. LE ultrasound/ABI were normal bilaterally.   Received request from dental office asking for cardiac risk ratification and requesting withholding of aspirin prior to deep dental cleaning, 3 dental extractions and potential fillings.  Tells me she has had a difficult year. Her son passed away and she has been dealing with a lot of grief. Subsequently, she has been dealing with a lot of anxiety. Feels as if she is now reliant upon her Ativan twice daily, rather than previous as-needed dosing. She became tearful as we talked. Tells me she hasn't been taking care of herself in the midst of her grief, but she is trying to start to. Getting her teeth taken care of is the beginning of that process. Tells me she is not ready to talk to anyone about her son's passing - encouraged her to consider grief counseling.   Tells me she has had some episodes of chest discomfort over the past year. But it is "different than when I had the heart attack". She attributes these episodes to when she gets upset about her son's passing. Tells me it is relieved by rest and her anxiety medications.   She has not been taking her Amlodipine nor Atorvastatin. She is uncertain exactly when she stopped. We discussed that the Amlodipine may help to prevent coronary artery spasm and she is agreeable to resume. Tells me she doesn't think she  has a cholesterol problem. LDL at time of SCAD was 191. She tells me she has adjusted her diet since having her MI. We discussed that while lifestyle changes can affect our cholesterol, genetics play a role as well. She was surprised to learn this and agreeable to have her cholesterol checked. After educated and shared decision making she tells me she would be willing to take cholesterol medication if her  results return elevated. Of note, was previously decreased from Atorvastatin 40mg  to Atorvastatin 20mg  due to myalgias.   Reports no edema, no SOB, no palpitations. No formal exercise routine, but reports no exercise intolerance. She manages her own household and is able to do housework without shortness of breath. Tells me she does not get short of breath with exertion with activities such as walking or climbing a flight of stairs. She watches her diet and avoids fried foods. Has been eating less due to her emotional state, but tries to supplement her fruit/vegetable intake with pre-made smoothies. Does not check her BP or weight at home. Weight today is stable - 4 lbs less than 1 year ago.   EKGs/Labs/Other Studies Reviewed:   The following studies were reviewed today:  Cardiac cath 07/2017 Conclusions: 1. No critical coronary artery disease.  There is abrupt tapering with up to 60% stenosis of OM2.  Given angiographic appearance and lack of other CAD, spontaneous coronary artery dissection and vasospasm are considerations.  Given TIMI-3 flow and resolution of chest pain, intervention was not attempted.  2. Normal left ventricular systolic function with question of subtle mid inferior hypokinesis. 3. Mildly elevated LVOT/aortic valve gradient.  Recommend echocardiographic correlation.   Echo 07/2017 - Left ventricle: The cavity size was normal. Wall thickness was   normal. Systolic function was normal. The estimated ejection   fraction was in the range of 55% to 60%. Wall motion was normal;   there were no regional wall motion abnormalities. Left   ventricular diastolic function parameters were normal. - Pulmonary arteries: Systolic pressure could not be accurately   estimated.  EKG:  EKG is ordered today.  The ekg ordered today demonstrates sinus rhythm rate 76 nonspecific ST/T wave abnormality stable when compared to previous.   Recent Labs: No recent labs available for review.     Recent Lipid Panel    Component Value Date/Time   CHOL 248 (H) 08/11/2017 0448   TRIG 84 08/11/2017 0448   HDL 40 (L) 08/11/2017 0448   CHOLHDL 6.2 08/11/2017 0448   VLDL 17 08/11/2017 0448   LDLCALC 191 (H) 08/11/2017 0448    Home Medications   Prior to Admission medications   Medication Sig Start Date End Date Taking? Authorizing Provider  amLODipine (NORVASC) 5 MG tablet Take 1 tablet (5 mg total) by mouth daily. 09/26/17 12/25/17  Rise Mu, PA-C  aspirin EC 81 MG tablet Take 1 tablet (81 mg total) by mouth daily. 08/23/17   End, Harrell Gave, MD  atorvastatin (LIPITOR) 20 MG tablet Take 1 tablet (20 mg total) by mouth daily. 08/23/17 11/21/17  End, Harrell Gave, MD  gabapentin (NEURONTIN) 600 MG tablet Take 600 mg by mouth 3 (three) times daily. 07/16/17   [provider]  LORazepam (ATIVAN) 2 MG tablet Take 1 tablet by mouth 2 (two) times daily as needed. 12/17/16   [provider]  venlafaxine XR (EFFEXOR-XR) 75 MG 24 hr capsule Take 225 mg by mouth daily. With food 01/14/17   [provider]    Review of Systems  Review of Systems  Constitution: Negative for chills, fever and malaise/fatigue.  Cardiovascular: Negative for chest pain, dyspnea on exertion, leg swelling and palpitations.  Respiratory: Negative for cough, shortness of breath and wheezing.   Gastrointestinal: Negative for nausea and vomiting.  Neurological: Negative for dizziness, light-headedness and weakness.  Psychiatric/Behavioral: Positive for depression. The patient is nervous/anxious.    All other systems reviewed and are otherwise negative except as noted above.  Physical Exam    VS:  BP 120/80 (BP Location: Left Arm, Patient Position: Sitting, Cuff Size: Normal)   Temp (!) 97.3 F (36.3 C)   Ht 5' 3.5" (1.613 m)   Wt 152 lb 8 oz (69.2 kg)   SpO2 96%   BMI 26.59 kg/m  , BMI Body mass index is 26.59 kg/m. GEN: Well nourished, well developed, in no acute distress. HEENT:  normal. Neck: Supple, no JVD, carotid bruits, or masses. Cardiac: RRR, no murmurs, rubs, or gallops. No clubbing, cyanosis, edema.  Radials/DP/PT 2+ and equal bilaterally.  Respiratory:  Respirations regular and unlabored, clear to auscultation bilaterally. GI: Soft, nontender, nondistended, BS + x 4. MS: no deformity or atrophy. Skin: warm and dry, no rash. Neuro:  Strength and sensation are intact. Psych: Normal affect. Tearful during conversation.   Accessory Clinical Findings    ECG personally reviewed by me today - SR rate 76 with nonspecific ST/T wave abnormality stable when compared to previous - no acute changes.  Assessment & Plan    1.  CAD (07/2017 NSTEMI secondary to SCAD) -  GDMT baby aspirin. Beta blocker previously transitioned to Amlodipine for fatigue. Sometime in the last year she self-discontinued both her Amlodipine and Atorvastatin. She is agreeable to restart her Amlodipine today. She has had a few episodes of chest discomfort "not pain" over the last year. Has had a difficult year due to her son passing and associates these episodes of discomfort with grief, anxiety. States they are different than her anginal equivalent. EKG today with acute ST/T wave changes. No indication for ischemic evaluation.   2. HLD - Last lipid profile 09/2017. Concern for familial HLD as LDL 08/11/17 191 and strong family history CAD. Has not been taking her Atorvastatin as she made good changes to her diet. She was surprised that genetics could play a role. Lipid profile, direct LDL, CMET today. She is agreeable to initiate statin if lipids are elevated. Of note, Atorvastatin previously not tolerated at 40mg  but tolerated at 20mg . Would consider previously tolerated Atorvastatin 20mg  vs Crestor and possible addition of Zetia pending results.   3. Preop cardiovascular exam - Scheduled for dental cleaning, tooth extraction with her dentist. Low risk procedure. Revised Cradiac Risk Index of 1 for  perioperative risk of major cardiac event 0.9%. Exercise tolerance >4 mets. She is at acceptable cardiac risk for this procedure. Reviewed with Dr. Okey DupreEnd.   She takes aspirin 81 mg daily. Would prefer she not have interruption in her therapy. If deemed necessary by her dentist, she may stop for up to 5 days prior to the procedure. Dentist will need to tell her when to restart aspirin after the procedure.   A copy of this note will be forwarded to her dentist's office via the [facility] Fax Function.   Disposition: Follow up in 6 months with APP or Dr. Okey DupreEnd.    Alver Sorrowaitlin S Talisha Erby, NP 10/10/2018, 1:54 PM

## 2018-10-09 ENCOUNTER — Telehealth: Payer: Self-pay | Admitting: Internal Medicine

## 2018-10-09 NOTE — Telephone Encounter (Signed)

## 2018-10-10 ENCOUNTER — Ambulatory Visit: Payer: Medicare Other | Admitting: Family

## 2018-10-10 ENCOUNTER — Other Ambulatory Visit: Payer: Self-pay

## 2018-10-10 ENCOUNTER — Encounter: Payer: Self-pay | Admitting: Physician Assistant

## 2018-10-10 VITALS — BP 120/80 | HR 76 | Temp 97.3°F | Ht 63.5 in | Wt 152.5 lb

## 2018-10-10 DIAGNOSIS — Z01810 Encounter for preprocedural cardiovascular examination: Secondary | ICD-10-CM

## 2018-10-10 DIAGNOSIS — I2510 Atherosclerotic heart disease of native coronary artery without angina pectoris: Secondary | ICD-10-CM | POA: Insufficient documentation

## 2018-10-10 DIAGNOSIS — E782 Mixed hyperlipidemia: Secondary | ICD-10-CM | POA: Diagnosis not present

## 2018-10-10 DIAGNOSIS — I25118 Atherosclerotic heart disease of native coronary artery with other forms of angina pectoris: Secondary | ICD-10-CM | POA: Diagnosis not present

## 2018-10-10 DIAGNOSIS — I25119 Atherosclerotic heart disease of native coronary artery with unspecified angina pectoris: Secondary | ICD-10-CM | POA: Insufficient documentation

## 2018-10-10 DIAGNOSIS — E785 Hyperlipidemia, unspecified: Secondary | ICD-10-CM | POA: Insufficient documentation

## 2018-10-10 MED ORDER — AMLODIPINE BESYLATE 5 MG PO TABS: 5.0000 mg | ORAL_TABLET | Freq: Every day | ORAL | 3 refills | Status: DC

## 2018-10-10 NOTE — Telephone Encounter (Signed)
Seen in office 10/10/18. PMH CAD s/p NSTEMI (likely SCAD) 07/2017. Has had some chest discomfort "not pain" over the past year which she attributes to grief and anxiety due to her son passing this year. States it is different than her anginal equivalent. No indication for ischemic evaluation at this time. Her exercise tolerance is >4 mets. Low risk dentistry procedure. Discussed with Dr. Saunders Revel. Deemed acceptable cardiac risk to proceed with her dental procedure.   Regarding antiplatelet therapy: Would prefer she not stop her aspirin 81mg . If deemed necessary by dentist, she may stop for up to 5 days beforehand. The dentist will need to instruct her when to resume the aspirin.   This encounter and office visit will be routed to [facility] via the [facility] Fax function.   Loel Dubonnet, NP

## 2018-10-10 NOTE — Patient Instructions (Signed)
Medication Instructions:  Your physician recommends that you continue on your current medications as directed. Please refer to the Current Medication list given to you today.  If you need a refill on your cardiac medications before your next appointment, please call your pharmacy.   Lab work: Your physician recommends that you have lab work today(CMET, lipid with direct LDL)  If you have labs (blood work) drawn today and your tests are completely normal, you will receive your results only by: Marland Kitchen MyChart Message (if you have MyChart) OR . A paper copy in the mail If you have any lab test that is abnormal or we need to change your treatment, we will call you to review the results.  Testing/Procedures: None ordered   Follow-Up: At [facility], you and your health needs are our priority.  As part of our continuing mission to provide you with exceptional heart care, we have created designated Provider Care Teams.  These Care Teams include your primary Cardiologist (physician) and Advanced Practice Providers (APPs -  Physician Assistants and Nurse Practitioners) who all work together to provide you with the care you need, when you need it. You will need a follow up appointment in 6 months.  Please call our office 2 months in advance to schedule this appointment.  You may see Nelva Bush, MD or Christell Faith, PA-C.

## 2018-10-11 LAB — COMPREHENSIVE METABOLIC PANEL
ALT: 11 IU/L (ref 0–32)
AST: 16 IU/L (ref 0–40)
Albumin/Globulin Ratio: 2 (ref 1.2–2.2)
Albumin: 4.4 g/dL (ref 3.8–4.9)
Alkaline Phosphatase: 75 IU/L (ref 39–117)
BUN/Creatinine Ratio: 12 (ref 9–23)
BUN: 9 mg/dL (ref 6–24)
Bilirubin Total: 0.6 mg/dL (ref 0.0–1.2)
CO2: 24 mmol/L (ref 20–29)
Calcium: 9.4 mg/dL (ref 8.7–10.2)
Chloride: 106 mmol/L (ref 96–106)
Creatinine, Ser: 0.75 mg/dL (ref 0.57–1.00)
GFR calc Af Amer: 103 mL/min/{1.73_m2} (ref >59)
GFR calc non Af Amer: 89 mL/min/{1.73_m2} (ref >59)
Globulin, Total: 2.2 g/dL (ref 1.5–4.5)
Glucose: 93 mg/dL (ref 65–99)
Potassium: 4.3 mmol/L (ref 3.5–5.2)
Sodium: 144 mmol/L (ref 134–144)
Total Protein: 6.6 g/dL (ref 6.0–8.5)

## 2018-10-11 LAB — LIPID PANEL
Chol/HDL Ratio: 6.6 ratio — ABNORMAL HIGH (ref 0.0–4.4)
Cholesterol, Total: 251 mg/dL — ABNORMAL HIGH (ref 100–199)
HDL: 38 mg/dL — ABNORMAL LOW (ref >39)
LDL Calculated: 184 mg/dL — ABNORMAL HIGH (ref 0–99)
Triglycerides: 143 mg/dL (ref 0–149)
VLDL Cholesterol Cal: 29 mg/dL (ref 5–40)

## 2018-10-11 LAB — LDL CHOLESTEROL, DIRECT: LDL Direct: 188 mg/dL — ABNORMAL HIGH (ref 0–99)

## 2018-10-12 ENCOUNTER — Telehealth: Payer: Self-pay | Admitting: Family

## 2018-10-12 DIAGNOSIS — E785 Hyperlipidemia, unspecified: Secondary | ICD-10-CM

## 2018-10-12 DIAGNOSIS — Z79899 Other long term (current) drug therapy: Secondary | ICD-10-CM

## 2018-10-12 DIAGNOSIS — E782 Mixed hyperlipidemia: Secondary | ICD-10-CM

## 2018-10-12 MED ORDER — ROSUVASTATIN CALCIUM 20 MG PO TABS: 20.0000 mg | ORAL_TABLET | Freq: Every day | ORAL | 3 refills | Status: DC

## 2018-10-12 NOTE — Telephone Encounter (Signed)
Attempted to call the patient. No answer- I left a message to please call back.  

## 2018-10-12 NOTE — Telephone Encounter (Signed)
Results called to pt. Pt verbalized understanding. She verbalized understanding to start Crestor 20 mg daily and go to the [facility] around 11/23/18 which is 6 weeks for repeat fasting labs. Rx sent to pharmacy. Lab orders entered.

## 2018-10-12 NOTE — Telephone Encounter (Signed)
Notes recorded by Loel Dubonnet, NP on 10/11/2018 at 6:08 PM EDT  Normal kidney and liver function. Her cholesterol levels are very elevated. LDL (bad cholesterol) is 184 and we would like it to be less than 70 given her cardiac history. Recommend Crestor 20mg  daily. Repeat lipid profile and LFT in 6 weeks.

## 2018-11-26 IMAGING — CT CT HEAD W/O CM
3 series · 16 of 47 positions shown, 19 images · non-contrast
Comparison: None.

CLINICAL DATA: Right hand and leg numbness. Subacute neurologic
deficit.

EXAM:
CT HEAD WITHOUT CONTRAST
TECHNIQUE: Contiguous axial images were obtained from the base of the skull
through the vertex without intravenous contrast.

[Series 2: head wo · axial · 0.42mm/px · z∈[+313,+438]mm · 10 of 31 slices shown, 13 images]
[im 3/31  brain]
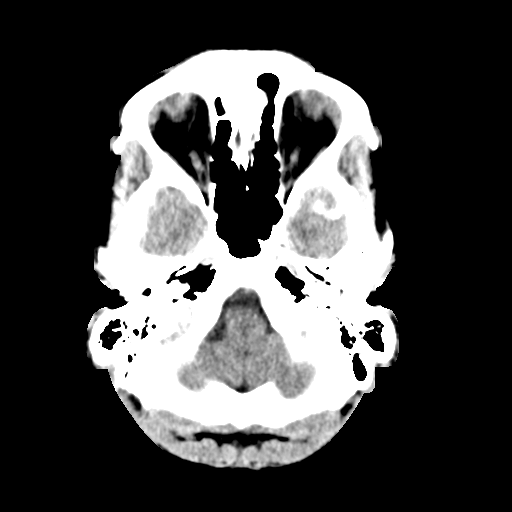
[im 3/31  bone]
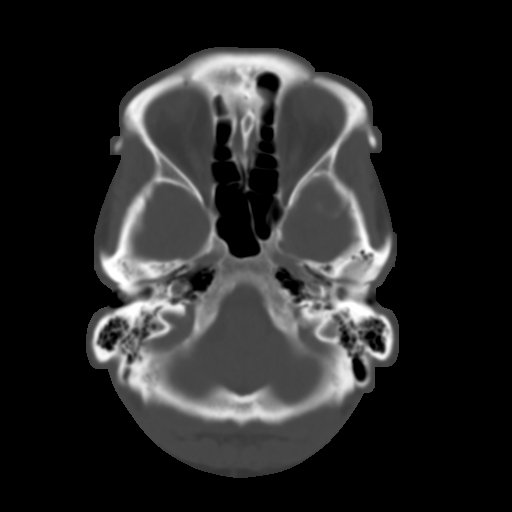
[im 6/31  brain]
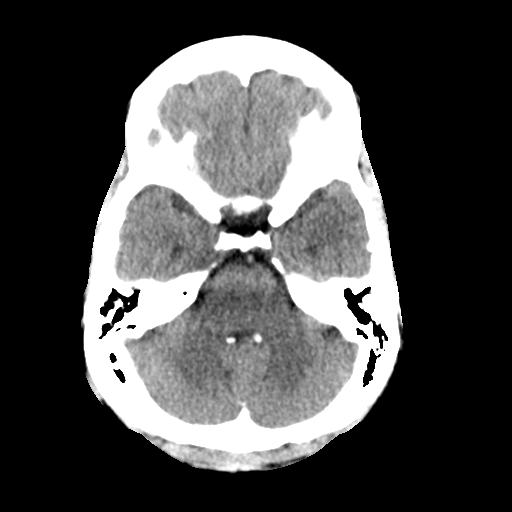
[im 9/31  brain]
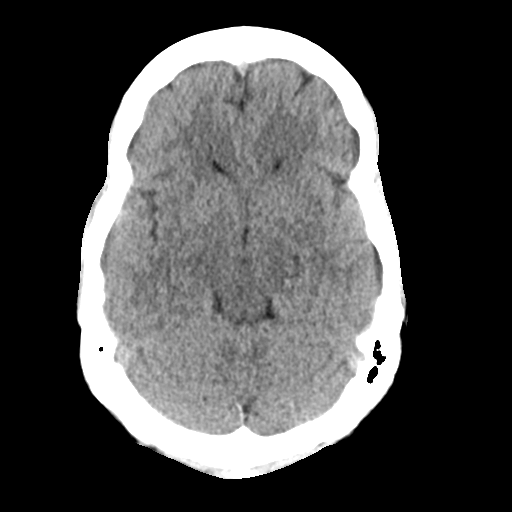
[im 11/31  brain]
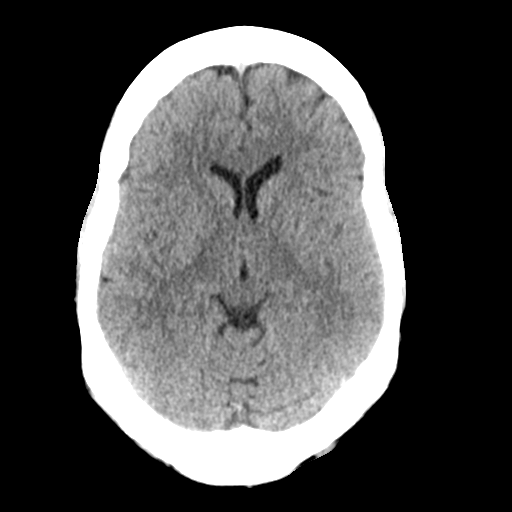
[im 14/31  brain]
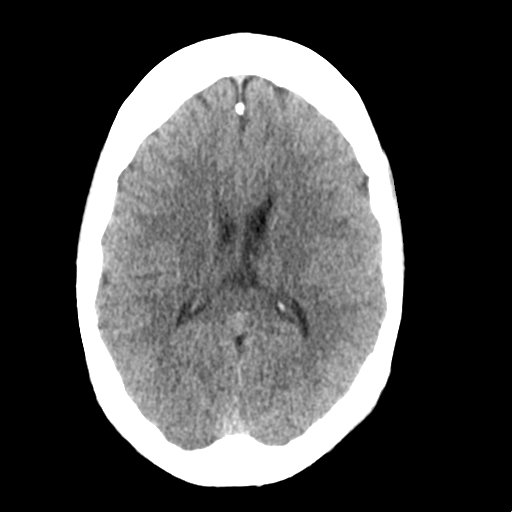
[im 14/31  bone]
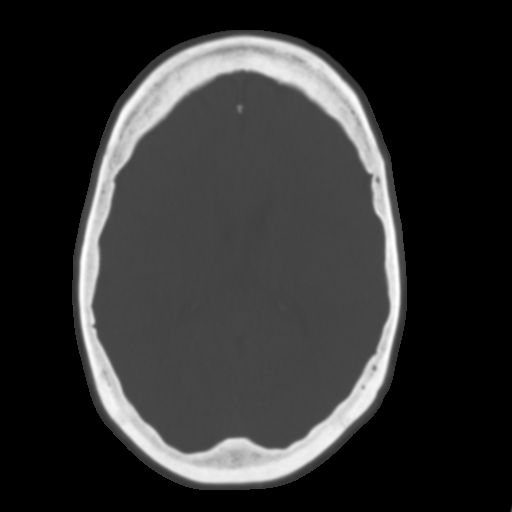
[im 17/31  brain]
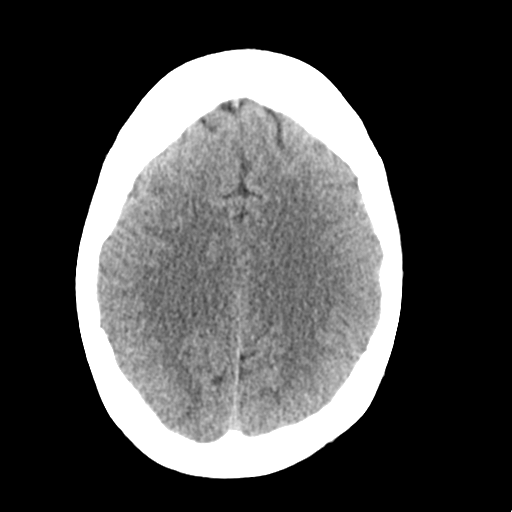
[im 20/31  brain]
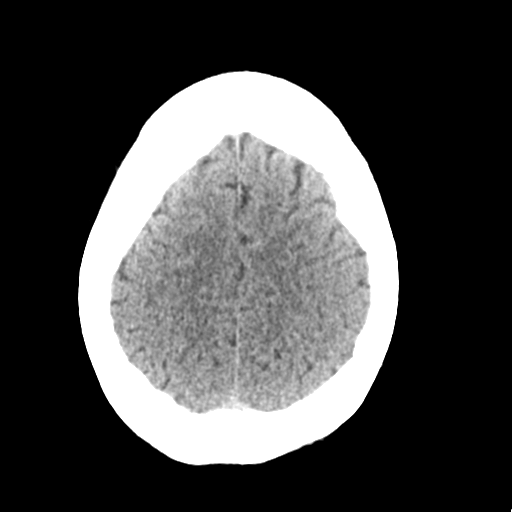
[im 23/31  brain]
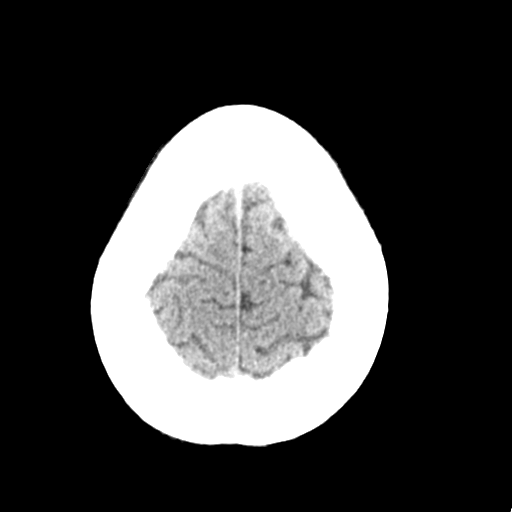
[im 25/31  brain]
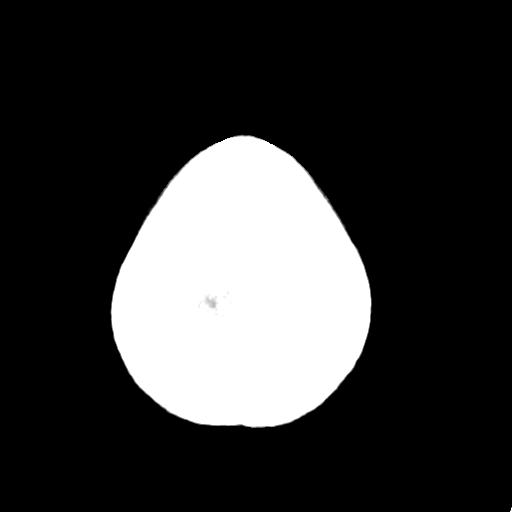
[im 25/31  bone]
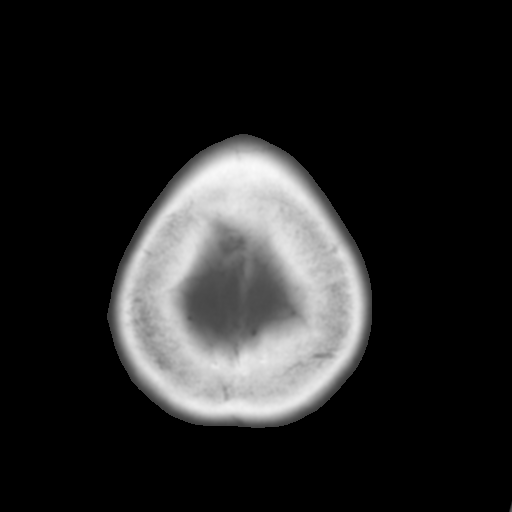
[im 28/31  brain]
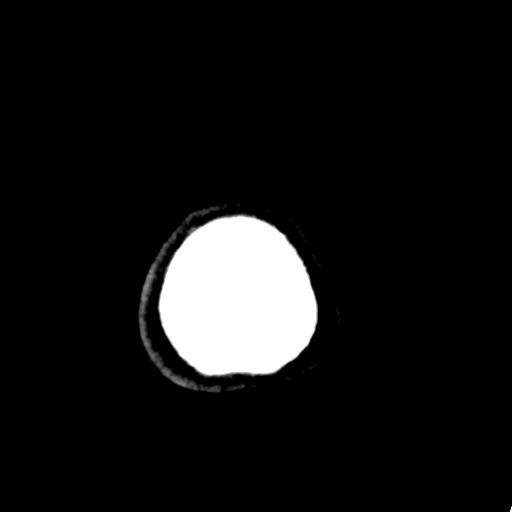

[Series 4: coronal soft tissue · coronal · 0.31mm/px · 3 of 66 slices shown]
[im 22/66  brain]
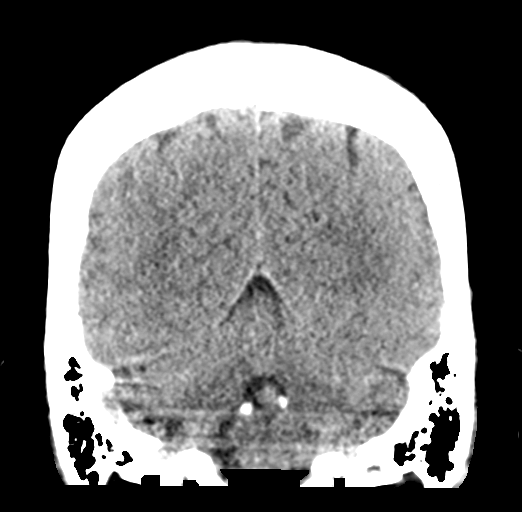
[im 29/66  brain]
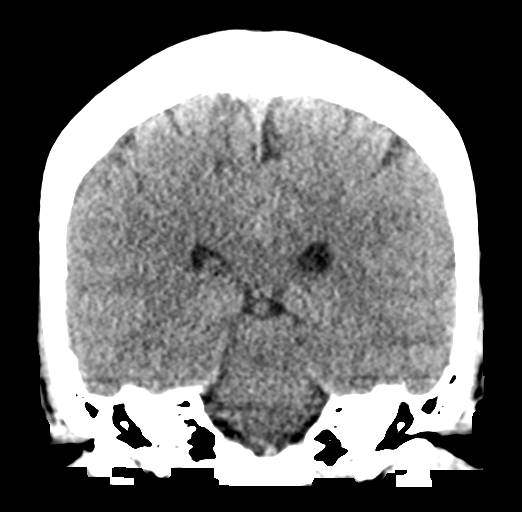
[im 37/66  brain]
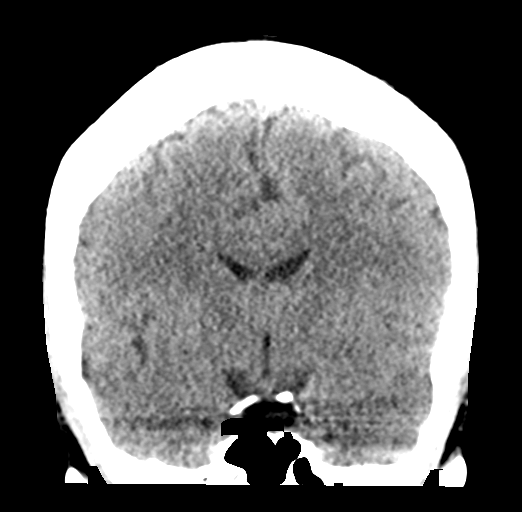

[Series 5: sagittal soft tissue · sagittal · 0.31mm/px · 3 of 52 slices shown]
[im 18/52  brain]
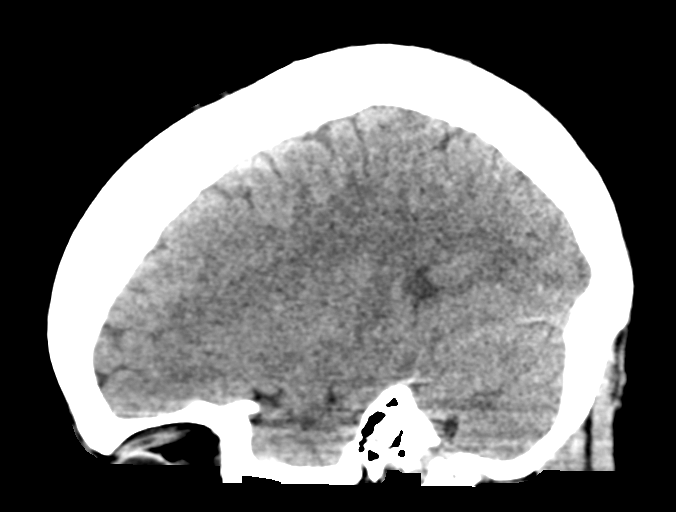
[im 26/52  brain]
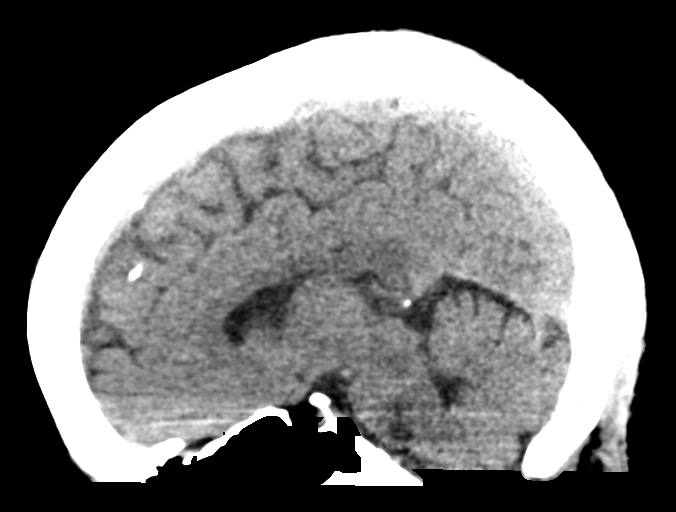
[im 35/52  brain]
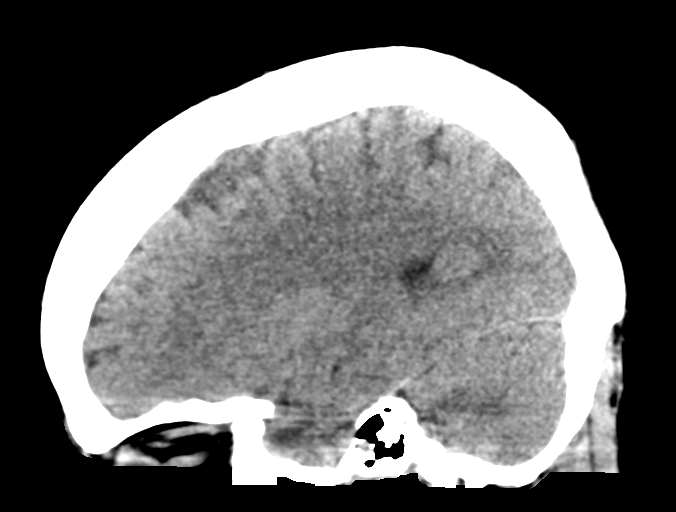

[16 of 47 positions shown; findings below may reference images not displayed]

FINDINGS: Brain: No intracranial hemorrhage, mass effect, or midline shift. No
hydrocephalus. The basilar cisterns are patent. No evidence of
territorial infarct or acute ischemia. No extra-axial or
intracranial fluid collection.

Vascular: No hyperdense vessel or unexpected calcification.

Skull: No fracture or focal lesion.

Sinuses/Orbits: Frontal sinuses are hypo pneumatized. No acute
finding.

Other: None.
IMPRESSION: No acute intracranial abnormality.

## 2019-02-13 HISTORY — PX: MOUTH SURGERY: SHX715

## 2019-04-10 NOTE — Progress Notes (Signed)
Cardiology Office Note    Date:  04/16/2019   ID:  Juanetta, Negash [birth year], MRN [id number]  PCP:  Rayetta Humphrey, MD  Cardiologist:  Yvonne Kendall, MD  Electrophysiologist:  None   Chief Complaint: Follow-up  History of Present Illness:   Brianna Mclean is a 59 y.o. 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   Patient was admitted to the hospital in 07/2017 with chest pain. EKG showed subtle inferior ST segment changes not diagnostic for STEMI. She underwent emergent LHC given persistent chest pain that revealed a narrowing of OM2 suggestive of SCAD with spontaneous recanalization. No other significant CAD was noted. She was managed medically. In follow up on 08/23/2017, she was doing well and had not had any further anginal chest pain, though did note some soreness along her anterior chest wall and shoulders. She was also fatigued, though was fairly sedentary. Her Lipitor was decreased to 20 mg daily. It was preferred that she remain on Coreg, if possible. She underwent renal artery ultrasound on 09/01/2017 given her SCAD to exclude FMD, which was negative.  She continued to note fatigue in 09/2017 leading to the discontinuation of carvedilol and initiation of amlodipine.  Lower extremity ABIs were normal bilaterally in 10/2017.  She was last seen in 09/2018 for preoperative cardiac risk evaluation for upcoming dental extractions, potential fillings, and deep dental cleaning.  She indicated her son had passed away that year and was dealing with a significant amount of grief and anxiety.  She did note some chest discomfort though these seem to to be related to her being upset with thinking about her son's passing.  She was not taking amlodipine or atorvastatin for an uncertain duration.  Repeat lipid panel showed a direct LDL of 188 and in this setting she was started on Crestor  20 mg daily.  She comes in doing well from a cardiac perspective.  She continues to be under significant amount of stress at home surrounding the trial of her sons death.  She is also undergoing a divorce.  She is very anxious surrounding the COVID-19 pandemic.  In the above setting, she has noted some intermittent shortness of breath though attributes this to being deconditioned as she has spent the most of the past year at home socially distancing and isolating without much exertion as well as in the setting of having to wear a mask.  Her dyspnea seems to be exacerbated when she gets worked up surrounding her above legal concerns.  She denies any chest pain or symptoms similar to her prior MI.  No lower extremity swelling, abdominal distention, orthopnea, or early satiety.  No falls, hematochezia, or melena.  She has been out of aspirin for approximately 1 month.  She seems to be tolerating rosuvastatin without issues.  She is tolerating amlodipine.   Labs independently reviewed: 09/2018 - BUN 9, SCr 0.75, potassium 4.3, albumin 4.4, AST/ALT normal, direct LDL 188, TC 251, TG 143, HDL 38, LDL 184  Past Medical History:  Diagnosis Date  . Anxiety   . Bipolar 1 disorder, depressed (HCC)   . Coronary artery disease    a. 07/2017 cath 60% stenosis OM2 likely scad, medical management.  . Hyperlipidemia   . Major depression   . 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  Obsessive compulsive disorder   . PTSD (post-traumatic stress disorder)     Past Surgical History:  Procedure Laterality Date  . CESAREAN SECTION    . COSMETIC SURGERY    . LEFT HEART CATH AND CORONARY ANGIOGRAPHY N/A 08/10/2017   Procedure: LEFT HEART CATH AND CORONARY ANGIOGRAPHY;  Surgeon: Yvonne Kendall, MD;  Location: [facility] INVASIVE CV LAB;  Service: Cardiovascular;  Laterality: N/A;  . MOUTH SURGERY  02/2019    Current Medications:  Current Meds  Medication Sig  . LORazepam (ATIVAN) 2 MG tablet Take 1 tablet by mouth 2 (two) times daily as needed.  . venlafaxine XR (EFFEXOR-XR) 75 MG 24 hr capsule Take 225 mg by mouth daily. With food    Allergies:   Percocet [oxycodone-acetaminophen]   Social History   Socioeconomic History  . Marital status: Single    Spouse name: Not on file  . Number of children: Not on file  . Years of education: Not on file  . Highest education level: Not on file  Occupational History  . Not on file  Tobacco Use  . Smoking status: Never Smoker  . Smokeless tobacco: Never Used  Substance and Sexual Activity  . Alcohol use: No  . Drug use: No  . Sexual activity: Never  Other Topics Concern  . Not on file  Social History Narrative  . Not on file   Social Determinants of Health   Financial Resource Strain:   . Difficulty of Paying Living Expenses: Not on file  Food Insecurity:   . Worried About Programme researcher, broadcasting/film/video in the Last Year: Not on file  . Ran Out of Food in the Last Year: Not on file  Transportation Needs:   . Lack of Transportation (Medical): Not on file  . Lack of Transportation (Non-Medical): Not on file  Physical Activity:   . Days of Exercise per Week: Not on file  . Minutes of Exercise per Session: Not on file  Stress:   . Feeling of Stress : Not on file  Social Connections:   . Frequency of Communication with Friends and Family: Not on file  . Frequency of Social Gatherings with Friends and Family: Not on file  . Attends Religious Services: Not on file  . Active Member of Clubs or Organizations: Not on file  . Attends Banker Meetings: Not on file  . Marital Status: Not on file     Family History:  The patient's family history includes Anxiety disorder in her mother; Depression in her father; Hyperlipidemia in her mother; Stroke in her mother.  ROS:   Review of Systems  Constitutional: Positive for malaise/fatigue. Negative for chills,  diaphoresis, fever and weight loss.  HENT: Negative for congestion.   Eyes: Negative for discharge and redness.  Respiratory: Positive for shortness of breath. Negative for cough, hemoptysis, sputum production and wheezing.   Cardiovascular: Negative for chest pain, palpitations, orthopnea, claudication, leg swelling and PND.  Gastrointestinal: Negative for abdominal pain, blood in stool, heartburn, melena, nausea and vomiting.  Genitourinary: Negative for hematuria.  Musculoskeletal: Negative for falls and myalgias.  Skin: Negative for rash.  Neurological: Negative for dizziness, tingling, tremors, sensory change, speech change, focal weakness, loss of consciousness and weakness.  Endo/Heme/Allergies: Does not bruise/bleed easily.  Psychiatric/Behavioral: Negative for substance abuse. The patient is nervous/anxious.   All other systems reviewed and are negative.    EKGs/Labs/Other Studies Reviewed:    Studies reviewed were summarized above. The additional studies were reviewed today:  LHC 07/2017: Conclusions: 1. No critical coronary artery disease.  There is abrupt tapering with up to 60% stenosis of OM2.  Given angiographic appearance and lack of other CAD, spontaneous coronary artery dissection and vasospasm are considerations.  Given TIMI-3 flow and resolution of chest pain, intervention was not attempted.  2. Normal left ventricular systolic function with question of subtle mid inferior hypokinesis. 3. Mildly elevated LVOT/aortic valve gradient.  Recommend echocardiographic correlation.  Recommendations: 1. Admit to ICU for at least 24 hours of monitoring. 2. Continue aspirin 81 mg daily indefinitely.  Avoid systemic anticoagulation in the acute setting, given concern for SCAD. 3. Aggressive blood pressure control; will initiate carvedilol 3.125 mg twice daily, with up titration as tolerated. 4. Atorvastatin 40 mg daily.  Check hemoglobin A1c and fasting lipid panel with a.m.  labs. 5. Trend troponin until it has peaked, then stop. 6. Obtain transthoracic echocardiogram. 7. Check urine drug screen. 8. __________  2D Echo 07/2017: - Left ventricle: The cavity size was normal. Wall thickness was   normal. Systolic function was normal. The estimated ejection   fraction was in the range of 55% to 60%. Wall motion was normal;   there were no regional wall motion abnormalities. Left   ventricular diastolic function parameters were normal. - Pulmonary arteries: Systolic pressure could not be accurately   estimated.   EKG:  EKG is ordered today.  The EKG ordered today demonstrates NSR, 71 bpm, normal axis, no acute ST-T changes  Recent Labs: 10/10/2018: ALT 11; BUN 9; Creatinine, Ser 0.75; Potassium 4.3; Sodium 144  Recent Lipid Panel    Component Value Date/Time   CHOL 251 (H) 10/10/2018 1429   TRIG 143 10/10/2018 1429   HDL 38 (L) 10/10/2018 1429   CHOLHDL 6.6 (H) 10/10/2018 1429   CHOLHDL 6.2 08/11/2017 0448   VLDL 17 08/11/2017 0448   LDLCALC 184 (H) 10/10/2018 1429   LDLDIRECT 188 (H) 10/10/2018 1429    PHYSICAL EXAM:    VS:  BP 130/80 (BP Location: Left Arm, Patient Position: Sitting, Cuff Size: Normal)   Pulse 71   Ht 5' 3.5" (1.613 m)   Wt 152 lb (68.9 kg)   SpO2 99%   BMI 26.50 kg/m   BMI: Body mass index is 26.5 kg/m.  Physical Exam  Constitutional: She is oriented to person, place, and time. She appears well-developed and well-nourished.  HENT:  Head: Normocephalic and atraumatic.  Eyes: Right eye exhibits no discharge. Left eye exhibits no discharge.  Neck: No JVD present.  Cardiovascular: Normal rate, regular rhythm, S1 normal, S2 normal and normal heart sounds. Exam reveals no distant heart sounds, no friction rub, no midsystolic click and no opening snap.  No murmur heard. Pulmonary/Chest: Effort normal and breath sounds normal. No respiratory distress. She has no decreased breath sounds. She has no wheezes. She has no rales. She  exhibits no tenderness.  Abdominal: Soft. She exhibits no distension. There is no abdominal tenderness.  Musculoskeletal:        General: No edema.     Cervical back: Normal range of motion.  Neurological: She is alert and oriented to person, place, and time.  Skin: Skin is warm and dry. No cyanosis. Nails show no clubbing.  Psychiatric: She has a normal mood and affect. Her speech is normal and behavior is normal. Judgment and thought content normal.    Wt Readings from Last 3 Encounters:  04/16/19 152 lb (68.9 kg)  10/10/18 152 lb 8 oz (69.2 kg)  09/26/17 156 lb (70.8 kg)     ASSESSMENT & PLAN:   1. NSTEMI secondary to SCAD: She is doing well but he symptoms concerning for recurrence.  She has been off aspirin 81 mg daily for the past month as she ran out.  Recommend she resume aspirin 81 mg daily.  Continue amlodipine 5 mg daily.  No plans for ischemic evaluation at this time.  2. Dyspnea: Her symptoms seem to be attributed to increased stress/anxiety surrounding the trial of her son's death, COVID-19 pandemic with facial covering, undergoing legal separation with her husband, and physical deconditioning.  Symptoms are not related to exertion and feel different than her prior MI.  I did offer her an echo to evaluate for any changes, which she would prefer to defer at this time until the above legal concerns are wrapped up.  She will let us know if symptoms change.  3. HLD: Previously self discontinued Lipitor in 2020 for unclear reasons.  Repeat direct LDL in 09/2018 off statin therapy showed a value of 188.  In this setting, she was started on Crestor 20 mg daily and seems to be tolerating this well.  Check lipid panel, direct LDL, and LFT.  Disposition: F/u with Dr. Saunders Revel or an APP in 3 months.   Medication Adjustments/Labs and Tests Ordered: Current medicines are reviewed at length with the patient today.  Concerns regarding medicines are outlined above. Medication changes, Labs and  Tests ordered today are summarized above and listed in the Patient Instructions accessible in Encounters.   Signed, Christell Faith, PA-C 04/16/2019 11:51 AM     [facility] [address] [place] [address] [place], [place] [address] [phone]

## 2019-04-16 ENCOUNTER — Other Ambulatory Visit: Payer: Self-pay

## 2019-04-16 ENCOUNTER — Ambulatory Visit (INDEPENDENT_AMBULATORY_CARE_PROVIDER_SITE_OTHER): Payer: Medicare Other | Admitting: Physician Assistant

## 2019-04-16 ENCOUNTER — Encounter: Payer: Self-pay | Admitting: Physician Assistant

## 2019-04-16 VITALS — BP 130/80 | HR 71 | Ht 63.5 in | Wt 152.0 lb

## 2019-04-16 DIAGNOSIS — E782 Mixed hyperlipidemia: Secondary | ICD-10-CM | POA: Diagnosis not present

## 2019-04-16 DIAGNOSIS — I2542 Coronary artery dissection: Secondary | ICD-10-CM

## 2019-04-16 DIAGNOSIS — R0600 Dyspnea, unspecified: Secondary | ICD-10-CM

## 2019-04-16 DIAGNOSIS — I2510 Atherosclerotic heart disease of native coronary artery without angina pectoris: Secondary | ICD-10-CM

## 2019-04-16 NOTE — Patient Instructions (Signed)
Medication Instructions:  Your physician recommends that you continue on your current medications as directed. Please refer to the Current Medication list given to you today.  *If you need a refill on your cardiac medications before your next appointment, please call your pharmacy*  Lab Work: Your physician recommends that you have lab work today(lipid and lft)  If you have labs (blood work) drawn today and your tests are completely normal, you will receive your results only by: Marland Kitchen MyChart Message (if you have MyChart) OR . A paper copy in the mail If you have any lab test that is abnormal or we need to change your treatment, we will call you to review the results.  Testing/Procedures: None ordered   Follow-Up: At [facility], you and your health needs are our priority.  As part of our continuing mission to provide you with exceptional heart care, we have created designated Provider Care Teams.  These Care Teams include your primary Cardiologist (physician) and Advanced Practice Providers (APPs -  Physician Assistants and Nurse Practitioners) who all work together to provide you with the care you need, when you need it.  Your next appointment:   3 month(s)  The format for your next appointment:   In Person  Provider:    You may see Yvonne Kendall, MD or Eula Listen, PA-C.

## 2019-04-17 ENCOUNTER — Telehealth: Payer: Self-pay

## 2019-04-17 LAB — HEPATIC FUNCTION PANEL
ALT: 14 IU/L (ref 0–32)
AST: 19 IU/L (ref 0–40)
Albumin: 4.1 g/dL (ref 3.8–4.9)
Alkaline Phosphatase: 86 IU/L (ref 39–117)
Bilirubin Total: 0.5 mg/dL (ref 0.0–1.2)
Bilirubin, Direct: 0.15 mg/dL (ref 0.00–0.40)
Total Protein: 6.2 g/dL (ref 6.0–8.5)

## 2019-04-17 LAB — LIPID PANEL
Chol/HDL Ratio: 3.2 ratio (ref 0.0–4.4)
Cholesterol, Total: 169 mg/dL (ref 100–199)
HDL: 53 mg/dL (ref >39)
LDL Chol Calc (NIH): 102 mg/dL — ABNORMAL HIGH (ref 0–99)
Triglycerides: 71 mg/dL (ref 0–149)
VLDL Cholesterol Cal: 14 mg/dL (ref 5–40)

## 2019-04-17 LAB — LDL CHOLESTEROL, DIRECT: LDL Direct: 108 mg/dL — ABNORMAL HIGH (ref 0–99)

## 2019-04-17 NOTE — Telephone Encounter (Signed)
-----   Message from Sondra Barges, PA-C sent at 04/17/2019  7:46 AM EST ----- Liver function normal. Cholesterol significantly improved on Crestor. No changes in medications.

## 2019-04-17 NOTE — Telephone Encounter (Signed)
Call to patient to review results.   Pt verbalized understanding and had no further questions at this time.   Advised pt to call for any further questions or concerns.

## 2019-05-28 IMAGING — CR DG CHEST 2V
1 series · 2 of 2 positions shown · non-contrast
Comparison: None.

CLINICAL DATA: Short of breath and chest pain

EXAM:
CHEST - 2 VIEW

[Series 1: dg chest 2 view · 0.14mm/px · 2 of 2 slices shown]
[im 1/2]
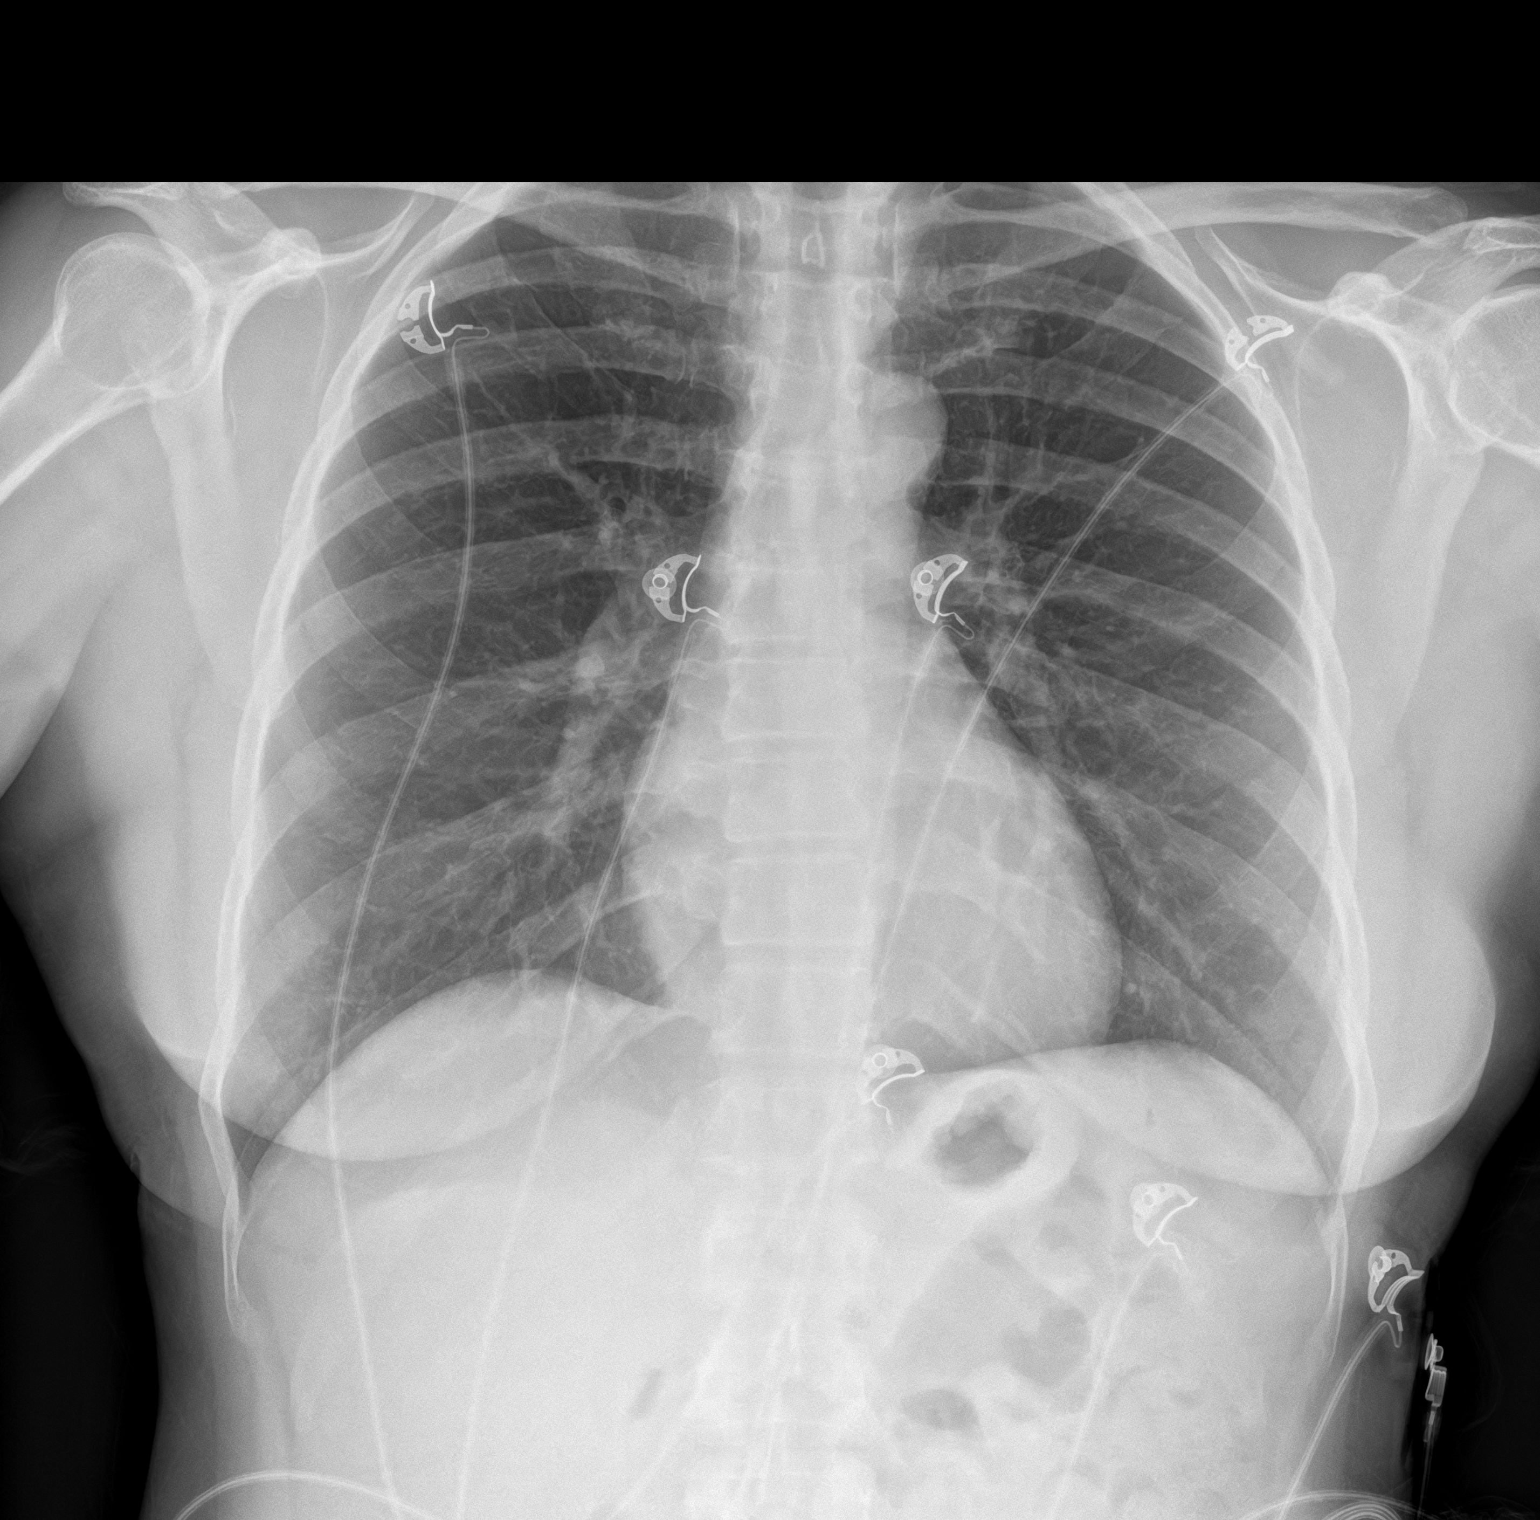
[im 2/2]
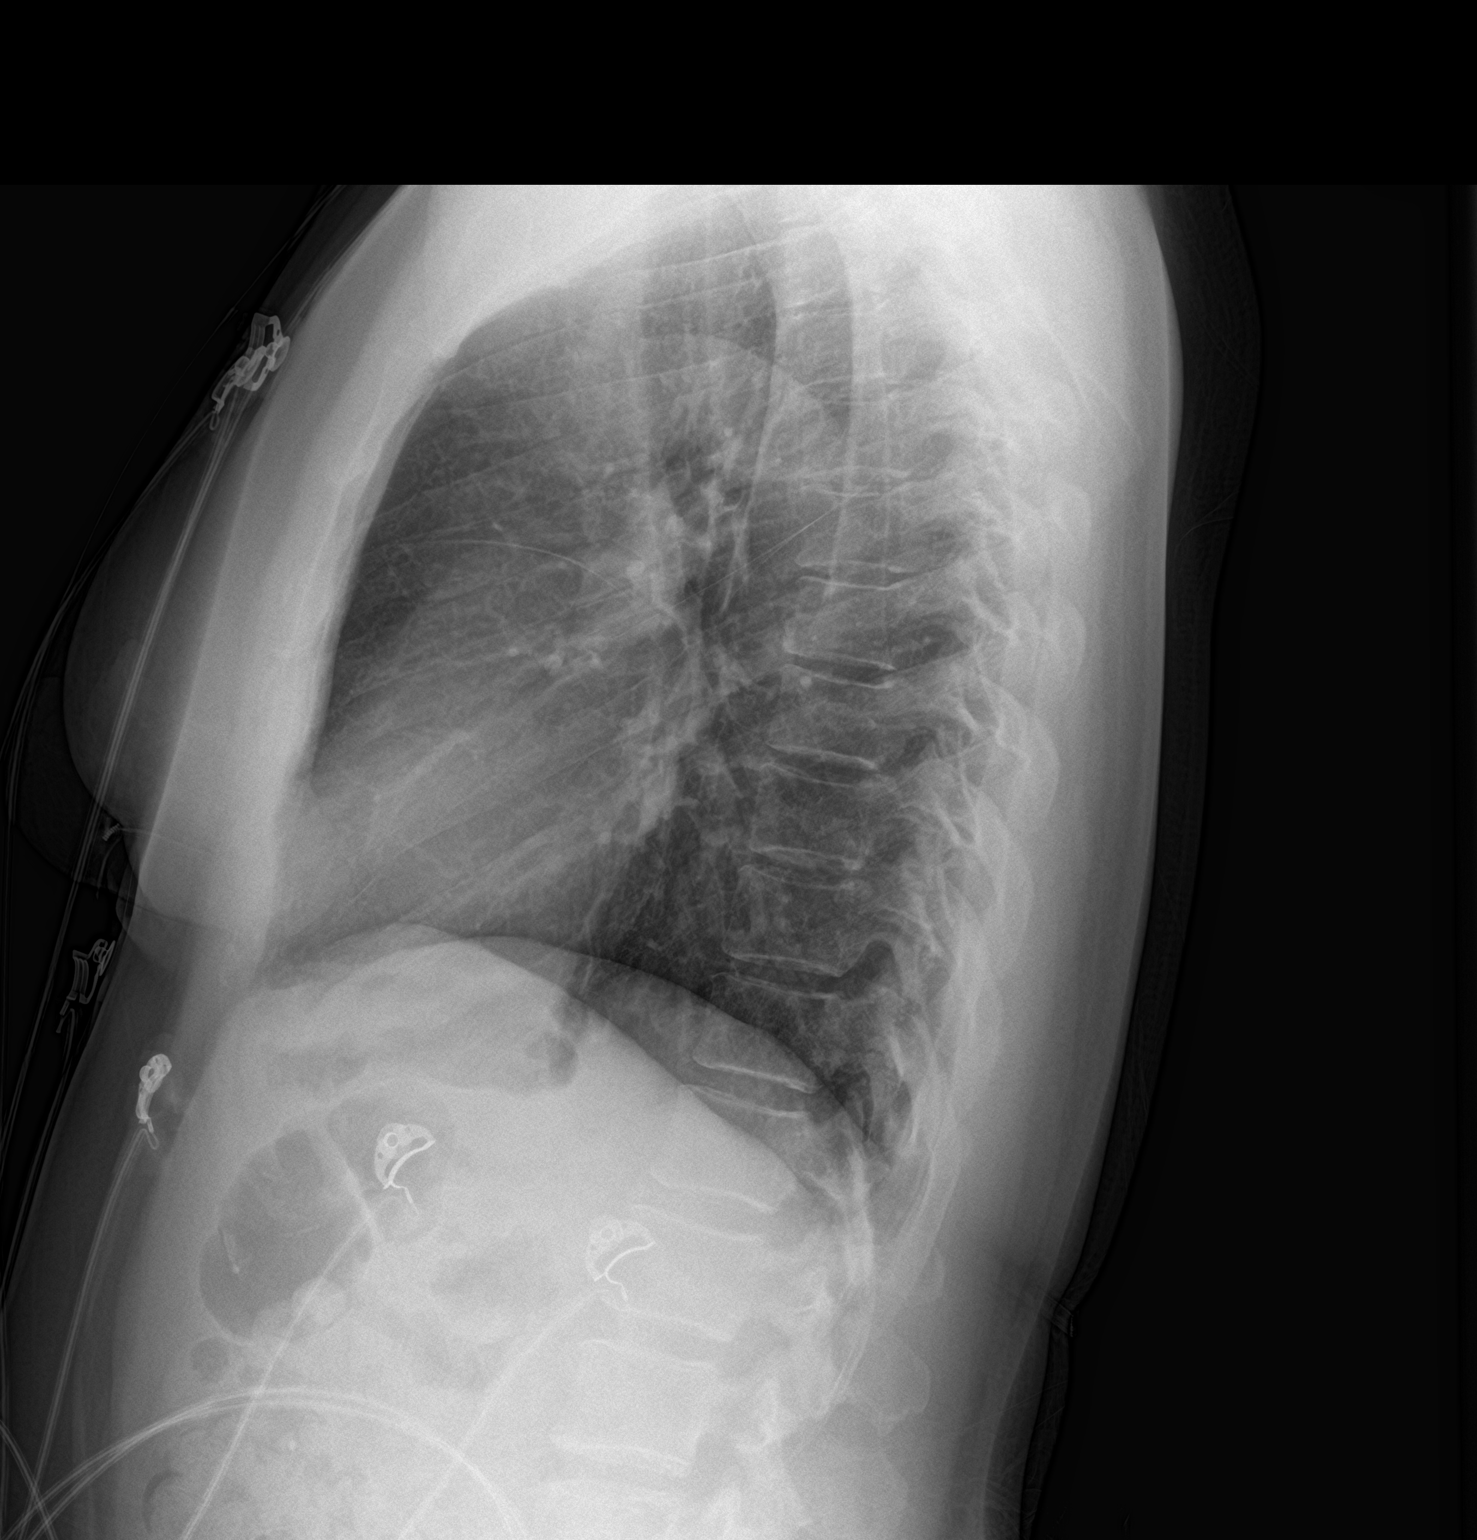

[2 of 2 positions shown; findings below may reference images not displayed]

FINDINGS: Normal heart size. Lungs clear. No pneumothorax. No pleural
effusion.
IMPRESSION: No active cardiopulmonary disease.

## 2019-07-18 ENCOUNTER — Ambulatory Visit: Payer: Medicare Other | Admitting: Internal Medicine

## 2019-09-03 ENCOUNTER — Other Ambulatory Visit: Payer: Self-pay

## 2019-09-03 ENCOUNTER — Encounter: Payer: Self-pay | Admitting: Internal Medicine

## 2019-09-03 ENCOUNTER — Ambulatory Visit (INDEPENDENT_AMBULATORY_CARE_PROVIDER_SITE_OTHER): Payer: Medicare Other | Admitting: Internal Medicine

## 2019-09-03 VITALS — BP 124/86 | HR 70 | Ht 63.5 in | Wt 162.5 lb

## 2019-09-03 DIAGNOSIS — E782 Mixed hyperlipidemia: Secondary | ICD-10-CM

## 2019-09-03 DIAGNOSIS — I25119 Atherosclerotic heart disease of native coronary artery with unspecified angina pectoris: Secondary | ICD-10-CM

## 2019-09-03 DIAGNOSIS — R0602 Shortness of breath: Secondary | ICD-10-CM

## 2019-09-03 NOTE — Progress Notes (Signed)
Follow-up Outpatient Visit Date: 09/03/2019  Primary Care Provider: Rayetta Humphrey, MD [address] [place] [address]  Chief Complaint: Shortness of breath and chest pain  HPI:  Brianna Mclean is a 59 y.o. 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  She was last seen in our office in 04/2019 by Eula Listen, PA.  She was doing well at that time but was under quite a bit of stress, dealing with her sons death as well as a divorce.  She reported intermittent shortness of breath which she attributes to deconditioning.  Echo was discussed, but [name] wished to defer at that time.  Today, [name] complains of continued shortness of breath with exertion.  She notices this the most when doing strenuous tasks at work.  She notes two episodes of chest pain over the last few months. The pain was sharp and associated with breathlessness.  The first episode occurred while at work, the second while seated at home.  Both lasted several hours before subsiding on their own.  [name] does not exercise regularly.  She denies lower extremity edema, palpitations, and lightheadedness.  --------------------------------------------------------------------------------------------------  Past Medical History:  Diagnosis Date  . Anxiety   . Bipolar 1 disorder, depressed (HCC)   . Coronary artery disease    a. 07/2017 cath 60% stenosis OM2 likely scad, medical management.  . Hyperlipidemia   . Major depression   . 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 Obsessive compulsive disorder   . PTSD (post-traumatic stress disorder)    Past Surgical History:  Procedure Laterality Date  . CESAREAN SECTION    . COSMETIC SURGERY    . LEFT HEART  CATH AND CORONARY ANGIOGRAPHY N/A 08/10/2017   Procedure: LEFT HEART CATH AND CORONARY ANGIOGRAPHY;  Surgeon: Yvonne Kendall, MD;  Location: [facility] INVASIVE CV LAB;  Service: Cardiovascular;  Laterality: N/A;  . MOUTH SURGERY  02/2019    Current Meds  Medication Sig  . amLODipine (NORVASC) 5 MG tablet Take 1 tablet (5 mg total) by mouth daily.  Marland Kitchen LORazepam (ATIVAN) 2 MG tablet Take 1 tablet by mouth 2 (two) times daily as needed.  . rosuvastatin (CRESTOR) 20 MG tablet Take 1 tablet (20 mg total) by mouth daily.  Marland Kitchen venlafaxine XR (EFFEXOR-XR) 75 MG 24 hr capsule Take 225 mg by mouth daily. With food    Allergies: Percocet [oxycodone-acetaminophen]  Social History   Tobacco Use  . Smoking status: Never Smoker  . Smokeless tobacco: Never Used  Vaping Use  . Vaping Use: Never used  Substance Use Topics  . Alcohol use: No  . Drug use: No    Family History  Problem Relation Age of Onset  . Anxiety disorder Mother   . Hyperlipidemia Mother   . Stroke Mother   . Depression Father     Review of Systems: A 12-system review of systems was performed and was negative except as noted in the HPI.  --------------------------------------------------------------------------------------------------  Physical Exam: BP 124/86 (BP Location: Left Arm, Patient Position: Sitting, Cuff Size: Normal)   Pulse 70   Ht 5' 3.5" (1.613 m)   Wt 162 lb 8 oz (73.7 kg)   SpO2 98%   BMI 28.33 kg/m   General:  NAD.  Pressured speech. Neck: No JVD  or HJR. Lungs: CTA bilaterally. Heart: RRR w/o m/r/g. Abd: Soft, NT/ND. Ext: No LE edema.  EKG:  NSR without abnormality.  Lab Results  Component Value Date   WBC 7.2 08/11/2017   HGB 13.4 08/11/2017   HCT 39.0 08/11/2017   MCV 82.5 08/11/2017   PLT 221 08/11/2017    Lab Results  Component Value Date   NA 144 10/10/2018   K 4.3 10/10/2018   CL 106 10/10/2018   CO2 24 10/10/2018   BUN 9 10/10/2018   CREATININE 0.75 10/10/2018   GLUCOSE 93  10/10/2018   ALT 14 04/16/2019    Lab Results  Component Value Date   CHOL 169 04/16/2019   HDL 53 04/16/2019   LDLCALC 102 (H) 04/16/2019   LDLDIRECT 108 (H) 04/16/2019   TRIG 71 04/16/2019   CHOLHDL 3.2 04/16/2019    --------------------------------------------------------------------------------------------------  ASSESSMENT AND PLAN: Coronary artery disease with history of SCAD and shortness of breath: [name] reports chronic exertional dyspnea and reports two episodes of atypical chest pain.  She is asymptomatic today with normal EKG and exam.  I have again recommended obtaining a transthoracic echocardiogram, which she is in agreement with.  We will continue aspirin, amlodipine, and rosuvastatin for secondary prevention.  Hyperlipidemia: Continue rosuvastatin 20 mg daily work on lifestyle modifications to achieve goal LDL less than 100 (preferrably < 70).  Follow-up: Return to clinic in 6 weeks.  Nelva Bush, MD 09/03/2019 12:06 PM

## 2019-09-03 NOTE — Patient Instructions (Signed)
Medication Instructions:  Your physician recommends that you continue on your current medications as directed. Please refer to the Current Medication list given to you today.  *If you need a refill on your cardiac medications before your next appointment, please call your pharmacy*   Lab Work: none If you have labs (blood work) drawn today and your tests are completely normal, you will receive your results only by: Marland Kitchen MyChart Message (if you have MyChart) OR . A paper copy in the mail If you have any lab test that is abnormal or we need to change your treatment, we will call you to review the results.   Testing/Procedures: Your physician has requested that you have an echocardiogram. Echocardiography is a painless test that uses sound waves to create images of your heart. It provides your doctor with information about the size and shape of your heart and how well your heart's chambers and valves are working. This procedure takes approximately one hour. There are no restrictions for this procedure. You may get an IV, if needed, to receive an ultrasound enhancing agent through to better visualize your heart.    Follow-Up: At [facility], you and your health needs are our priority.  As part of our continuing mission to provide you with exceptional heart care, we have created designated Provider Care Teams.  These Care Teams include your primary Cardiologist (physician) and Advanced Practice Providers (APPs -  Physician Assistants and Nurse Practitioners) who all work together to provide you with the care you need, when you need it.  We recommend signing up for the patient portal called "MyChart".  Sign up information is provided on this After Visit Summary.  MyChart is used to connect with patients for Virtual Visits (Telemedicine).  Patients are able to view lab/test results, encounter notes, upcoming appointments, etc.  Non-urgent messages can be sent to your provider as well.   To learn more  about what you can do with MyChart, go to [email or web address].    Your next appointment:   6 week(s)  The format for your next appointment:   In Person  Provider:    You may see Yvonne Kendall, MD or one of the following Advanced Practice Providers on your designated Care Team:    Nicolasa Ducking, NP  Eula Listen, PA-C  Marisue Ivan, PA-C    Echocardiogram An echocardiogram is a procedure that uses painless sound waves (ultrasound) to produce an image of the heart. Images from an echocardiogram can provide important information about:  Signs of coronary artery disease (CAD).  Aneurysm detection. An aneurysm is a weak or damaged part of an artery wall that bulges out from the normal force of blood pumping through the body.  Heart size and shape. Changes in the size or shape of the heart can be associated with certain conditions, including heart failure, aneurysm, and CAD.  Heart muscle function.  Heart valve function.  Signs of a past heart attack.  Fluid buildup around the heart.  Thickening of the heart muscle.  A tumor or infectious growth around the heart valves. Tell a health care provider about:  Any allergies you have.  All medicines you are taking, including vitamins, herbs, eye drops, creams, and over-the-counter medicines.  Any blood disorders you have.  Any surgeries you have had.  Any medical conditions you have.  Whether you are pregnant or may be pregnant. What are the risks? Generally, this is a safe procedure. However, problems may occur, including:  Allergic reaction to dye (  contrast) that may be used during the procedure. What happens before the procedure? No specific preparation is needed. You may eat and drink normally. What happens during the procedure?   An IV tube may be inserted into one of your veins.  You may receive contrast through this tube. A contrast is an injection that improves the quality of the pictures from  your heart.  A gel will be applied to your chest.  A wand-like tool (transducer) will be moved over your chest. The gel will help to transmit the sound waves from the transducer.  The sound waves will harmlessly bounce off of your heart to allow the heart images to be captured in real-time motion. The images will be recorded on a computer. The procedure may vary among health care providers and hospitals. What happens after the procedure?  You may return to your normal, everyday life, including diet, activities, and medicines, unless your health care provider tells you not to do that. Summary  An echocardiogram is a procedure that uses painless sound waves (ultrasound) to produce an image of the heart.  Images from an echocardiogram can provide important information about the size and shape of your heart, heart muscle function, heart valve function, and fluid buildup around your heart.  You do not need to do anything to prepare before this procedure. You may eat and drink normally.  After the echocardiogram is completed, you may return to your normal, everyday life, unless your health care provider tells you not to do that. This information is not intended to replace advice given to you by your health care provider. Make sure you discuss any questions you have with your health care provider. Document Revised: 06/22/2018 Document Reviewed: 04/03/2016 Elsevier Patient Education  [employer].

## 2019-09-28 ENCOUNTER — Ambulatory Visit (INDEPENDENT_AMBULATORY_CARE_PROVIDER_SITE_OTHER): Payer: Medicare Other

## 2019-09-28 ENCOUNTER — Other Ambulatory Visit: Payer: Self-pay

## 2019-09-28 ENCOUNTER — Ambulatory Visit: Payer: Medicare Other | Admitting: Family

## 2019-09-28 DIAGNOSIS — R0602 Shortness of breath: Secondary | ICD-10-CM | POA: Diagnosis not present

## 2019-09-28 DIAGNOSIS — I25119 Atherosclerotic heart disease of native coronary artery with unspecified angina pectoris: Secondary | ICD-10-CM

## 2019-09-28 LAB — ECHOCARDIOGRAM COMPLETE
AR max vel: 2.66 cm2
AV Area VTI: 2.53 cm2
AV Area mean vel: 2.22 cm2
AV Mean grad: 5 mmHg
AV Peak grad: 10.4 mmHg
Ao pk vel: 1.61 m/s
Area-P 1/2: 2.8 cm2
Calc EF: 56.6 %
S' Lateral: 2.3 cm
Single Plane A2C EF: 57.5 %
Single Plane A4C EF: 55.5 %

## 2019-10-01 ENCOUNTER — Other Ambulatory Visit: Payer: Self-pay | Admitting: Gerontology

## 2019-10-01 DIAGNOSIS — Z1231 Encounter for screening mammogram for malignant neoplasm of breast: Secondary | ICD-10-CM

## 2019-10-15 ENCOUNTER — Ambulatory Visit: Payer: Medicare Other | Admitting: Family

## 2019-10-17 ENCOUNTER — Ambulatory Visit (INDEPENDENT_AMBULATORY_CARE_PROVIDER_SITE_OTHER): Payer: Medicare Other | Admitting: Family

## 2019-10-17 ENCOUNTER — Encounter: Payer: Self-pay | Admitting: Family

## 2019-10-17 ENCOUNTER — Other Ambulatory Visit: Payer: Self-pay

## 2019-10-17 VITALS — BP 130/90 | HR 73 | Ht 63.5 in | Wt 165.2 lb

## 2019-10-17 DIAGNOSIS — E785 Hyperlipidemia, unspecified: Secondary | ICD-10-CM

## 2019-10-17 DIAGNOSIS — R0602 Shortness of breath: Secondary | ICD-10-CM

## 2019-10-17 DIAGNOSIS — R5382 Chronic fatigue, unspecified: Secondary | ICD-10-CM | POA: Diagnosis not present

## 2019-10-17 DIAGNOSIS — I25118 Atherosclerotic heart disease of native coronary artery with other forms of angina pectoris: Secondary | ICD-10-CM

## 2019-10-17 DIAGNOSIS — I340 Nonrheumatic mitral (valve) insufficiency: Secondary | ICD-10-CM

## 2019-10-17 MED ORDER — AMLODIPINE BESYLATE 5 MG PO TABS: 5.0000 mg | ORAL_TABLET | Freq: Every day | ORAL | 3 refills | Status: DC

## 2019-10-17 MED ORDER — ROSUVASTATIN CALCIUM 20 MG PO TABS: 20.0000 mg | ORAL_TABLET | Freq: Every day | ORAL | 3 refills | Status: DC

## 2019-10-17 NOTE — Patient Instructions (Addendum)
Medication Instructions:  No medication changes today.   *If you need a refill on your cardiac medications before your next appointment, please call your pharmacy*   Lab Work: No lab work today. Your recent cholesterol panel at your primary care provider looks great!  Testing/Procedures: Your EKG today shows normal sinus rhythm.  Your echocardiogram showed your heart is pumping and relaxing as it should. Your mitral valve is mildly leaky which we keep only mildly leaky by keeping your blood pressure well controlled.   Follow-Up: At [facility], you and your health needs are our priority.  As part of our continuing mission to provide you with exceptional heart care, we have created designated Provider Care Teams.  These Care Teams include your primary Cardiologist (physician) and Advanced Practice Providers (APPs -  Physician Assistants and Nurse Practitioners) who all work together to provide you with the care you need, when you need it.  We recommend signing up for the patient portal called "MyChart".  Sign up information is provided on this After Visit Summary.  MyChart is used to connect with patients for Virtual Visits (Telemedicine).  Patients are able to view lab/test results, encounter notes, upcoming appointments, etc.  Non-urgent messages can be sent to your provider as well.   To learn more about what you can do with MyChart, go to [email or web address].    Your next appointment:   Your physician wants you to follow-up in: 6 months You will receive a reminder letter in the mail two months in advance. If you don't receive a letter, please call our office to schedule the follow-up appointment.   The format for your next appointment:   In Person  Provider:   You may see Yvonne Kendall, MD or one of the following Advanced Practice Providers on your designated Care Team:    Nicolasa Ducking, NP  Eula Listen, PA-C  Marisue Ivan, PA-C  Gillian Shields, NP  Other  Instructions  Our goal is for your blood pressure to be less than 130/80. If your blood pressure is consistently elevated at home, please give [place] a call.

## 2019-10-17 NOTE — Progress Notes (Signed)
Office Visit    Patient Name: Brianna Mclean Date of Encounter: 10/17/2019  Primary Care Provider:  Rayetta Humphrey, MD Primary Cardiologist:  Yvonne Kendall, MD Electrophysiologist:  None   Chief Complaint    Brianna Mclean is a 59 y.o. female with a hx of CAD s/p NSTEMI secondary to SCAD of OM branch (07/2017), anxiety/depression, PTSD presents today for follow up after echocardiogram   Past Medical History    Past Medical History:  Diagnosis Date  . Anxiety   . Bipolar 1 disorder, depressed (HCC)   . Coronary artery disease    a. 07/2017 cath 60% stenosis OM2 likely scad, medical management.  . Hyperlipidemia   . Major depression   . 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 Obsessive compulsive disorder   . PTSD (post-traumatic stress disorder)    Past Surgical History:  Procedure Laterality Date  . CESAREAN SECTION    . COSMETIC SURGERY    . LEFT HEART CATH AND CORONARY ANGIOGRAPHY N/A 08/10/2017   Procedure: LEFT HEART CATH AND CORONARY ANGIOGRAPHY;  Surgeon: Yvonne Kendall, MD;  Location: [facility] INVASIVE CV LAB;  Service: Cardiovascular;  Laterality: N/A;  . MOUTH SURGERY  02/2019    Allergies  Allergies  Allergen Reactions  . Percocet [Oxycodone-Acetaminophen] Itching    History of Present Illness    Brianna Mclean is a 59 y.o. 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 She was last seen 09/03/19 by Dr. Okey Dupre.  She was seen 04/2019 with noted increased stress, dealing with her sons death as well as a divorce. She noted some intermittent shortness of breath which she attributed to deconditioning. Echo was discussed, but deferred.  Seen 08/2019 with continued dyspnea with exertion. Also noted two episodes of chest pain associated with breathlessness at rest lasting several hours before self resolving. She was  recommended for echocardiogram.   Echo 09/28/19 with LVEF 60-65%, no RWMA, RV normal size and function, mild MR.   Labs via Care Everywhere 10/01/19:  Hb 13.4, AST 15, ALT 13, creatinine 0.8, K 4.4, GFR 90, A1c 5.2, TSH 1.678,   Total cholesterol 153, triglyceride 136, HDL 49.6, LDL 76  Since last seen she has established with a new primary care provider at [facility] in [place].  She had substantial lab work detailed above.  Her cholesterol numbers have been marked improvement.  She tells me though she is not exercising due to fatigue she has made changes in her diet and is avoiding fried foods and red meat.  Per her report some of her lab work were elevated (likely CRP/ANA which are unavailable for review) and she has an appoint with rheumatology as they were worried about rheumatoid arthritis.  We reviewed her echocardiogram in detail.  Reassurance provided regarding heart pumping function.  We reviewed mild mitral regurgitation and goal for blood pressure goal of less than 130/80.  Reports her fatigue and dyspnea on exertion are about the same.  Reports no chest pain, pressure, tightness.  Reports no lightheadedness, dizziness, near-syncope, syncope. EKGs/Labs/Other Studies Reviewed:   The following studies were reviewed today: Echo 09/28/19  1. Left ventricular ejection fraction, by estimation, is 60 to 65%. The  left ventricle has normal function. The left ventricle has no regional  wall motion abnormalities. Left ventricular diastolic parameters are  indeterminate.   2. Right ventricular systolic function is normal.  The right ventricular  size is normal.   3. The mitral valve is normal in structure. Mild mitral valve  regurgitation.   EKG:  EKG is ordered today.  The ekg ordered today demonstrates NSR 73 bpm with no acute ST/T wave changes.   Recent Labs: 04/16/2019: ALT 14  Recent Lipid Panel    Component Value Date/Time   CHOL 169 04/16/2019 1214   TRIG 71 04/16/2019 1214    HDL 53 04/16/2019 1214   CHOLHDL 3.2 04/16/2019 1214   CHOLHDL 6.2 08/11/2017 0448   VLDL 17 08/11/2017 0448   LDLCALC 102 (H) 04/16/2019 1214   LDLDIRECT 108 (H) 04/16/2019 1214    Home Medications   Current Meds  Medication Sig  . amLODipine (NORVASC) 5 MG tablet Take 1 tablet (5 mg total) by mouth daily.  Marland Kitchen aspirin EC 81 MG tablet Take 81 mg by mouth daily. Swallow whole.  Marland Kitchen LORazepam (ATIVAN) 2 MG tablet Take 1 tablet by mouth 2 (two) times daily as needed.  . rosuvastatin (CRESTOR) 20 MG tablet Take 1 tablet (20 mg total) by mouth daily.  Marland Kitchen venlafaxine XR (EFFEXOR-XR) 75 MG 24 hr capsule Take 225 mg by mouth daily. With food      Review of Systems     Review of Systems  Constitutional: Positive for malaise/fatigue. Negative for chills and fever.  Cardiovascular: Positive for dyspnea on exertion. Negative for chest pain, leg swelling, near-syncope, orthopnea, palpitations and syncope.  Respiratory: Negative for cough, shortness of breath and wheezing.   Gastrointestinal: Negative for nausea and vomiting.  Neurological: Negative for dizziness, light-headedness and weakness.   All other systems reviewed and are otherwise negative except as noted above.  Physical Exam    VS:  BP 130/90 (BP Location: Left Arm, Patient Position: Sitting, Cuff Size: Normal)   Pulse 73   Ht 5' 3.5" (1.613 m)   Wt 165 lb 4 oz (75 kg)   SpO2 97%   BMI 28.81 kg/m  , BMI Body mass index is 28.81 kg/m. GEN: Well nourished, well developed, in no acute distress. HEENT: normal. Neck: Supple, no JVD, carotid bruits, or masses. Cardiac: RRR, no murmurs, rubs, or gallops. No clubbing, cyanosis, edema.  Radials/DP/PT 2+ and equal bilaterally.  Respiratory:  Respirations regular and unlabored, clear to auscultation bilaterally. GI: Soft, nontender, nondistended, BS + x 4. MS: No deformity or atrophy. Skin: Warm and dry, no rash. Neuro:  Strength and sensation are intact. Psych: Normal  affect.  Assessment & Plan    1. CAD with history of SCAD -stable with no anginal symptoms.  Recent echo with no regional wall motion abnormalities.  GDMT includes amlodipine 5 mg daily, aspirin 80 mg daily, Crestor 20 mg daily.  No indication for ischemic evaluation at this time.  Continue low-sodium, heart healthy diet.  2. DOE/fatigue-recent echo with normal LVEF, no regional wall motion abnormalities, mild MR.  No heart failure no signs of worsening coronary disease.  Recent labs at PCP with normal TSH, CBC, kidney, liver function.  Consider noncardiac causes of dyspnea and fatigue.  Has upcoming appointment with rheumatology.  3. HLD -  Recent LDL of 76 at her primary care office.  Continue Crestor 20 mg daily.  She has made marked improvement in her cholesterol management from LDL 1 year ago of 191.  4. Mild MR -noted by echo 09/2019.  Continue optimal blood pressure control with BP goal less than 130/80.  Disposition: Follow up in 6 month(s) with Dr. Okey Dupre  or APP   Alver Sorrow, NP 10/17/2019, 1:46 PM

## 2020-01-08 ENCOUNTER — Other Ambulatory Visit: Payer: Self-pay | Admitting: Physician Assistant

## 2020-01-08 DIAGNOSIS — I25118 Atherosclerotic heart disease of native coronary artery with other forms of angina pectoris: Secondary | ICD-10-CM

## 2020-03-20 ENCOUNTER — Other Ambulatory Visit: Admission: RE | Admit: 2020-03-20 | Payer: Medicare Other | Source: Ambulatory Visit

## 2020-03-24 ENCOUNTER — Encounter: Admission: RE | Payer: Self-pay | Source: Home / Self Care

## 2020-03-24 ENCOUNTER — Ambulatory Visit: Admission: RE | Admit: 2020-03-24 | Payer: Medicare Other | Source: Home / Self Care

## 2020-03-24 SURGERY — COLONOSCOPY
Anesthesia: General

## 2020-04-11 ENCOUNTER — Other Ambulatory Visit
Admission: RE | Admit: 2020-04-11 | Discharge: 2020-04-11 | Disposition: A | Discharge status: Home / Self Care | Payer: Medicare Other | Source: Ambulatory Visit | Attending: Family | Admitting: Family

## 2020-04-11 ENCOUNTER — Encounter: Payer: Self-pay | Admitting: Family

## 2020-04-11 ENCOUNTER — Other Ambulatory Visit: Payer: Self-pay

## 2020-04-11 ENCOUNTER — Ambulatory Visit: Payer: Medicare Other | Admitting: Family

## 2020-04-11 VITALS — BP 122/80 | HR 69 | Ht 63.5 in | Wt 164.0 lb

## 2020-04-11 DIAGNOSIS — I25118 Atherosclerotic heart disease of native coronary artery with other forms of angina pectoris: Secondary | ICD-10-CM | POA: Insufficient documentation

## 2020-04-11 DIAGNOSIS — E785 Hyperlipidemia, unspecified: Secondary | ICD-10-CM | POA: Insufficient documentation

## 2020-04-11 DIAGNOSIS — R079 Chest pain, unspecified: Secondary | ICD-10-CM | POA: Diagnosis present

## 2020-04-11 DIAGNOSIS — F419 Anxiety disorder, unspecified: Secondary | ICD-10-CM

## 2020-04-11 LAB — LIPID PANEL
Cholesterol: 168 mg/dL (ref 0–200)
HDL: 47 mg/dL (ref >40)
LDL Cholesterol: 103 mg/dL — ABNORMAL HIGH (ref 0–99)
Total CHOL/HDL Ratio: 3.6 RATIO
Triglycerides: 88 mg/dL (ref <150)
VLDL: 18 mg/dL (ref 0–40)

## 2020-04-11 LAB — COMPREHENSIVE METABOLIC PANEL
ALT: 15 U/L (ref 0–44)
AST: 17 U/L (ref 15–41)
Albumin: 3.9 g/dL (ref 3.5–5.0)
Alkaline Phosphatase: 68 U/L (ref 38–126)
Anion gap: 10 (ref 5–15)
BUN: 12 mg/dL (ref 6–20)
CO2: 28 mmol/L (ref 22–32)
Calcium: 9.2 mg/dL (ref 8.9–10.3)
Chloride: 105 mmol/L (ref 98–111)
Creatinine, Ser: 0.79 mg/dL (ref 0.44–1.00)
GFR, Estimated: >60 mL/min (ref >60)
Glucose, Bld: 97 mg/dL (ref 70–99)
Potassium: 4 mmol/L (ref 3.5–5.1)
Sodium: 143 mmol/L (ref 135–145)
Total Bilirubin: 1 mg/dL (ref 0.3–1.2)
Total Protein: 7.1 g/dL (ref 6.5–8.1)

## 2020-04-11 LAB — TROPONIN I (HIGH SENSITIVITY): Troponin I (High Sensitivity): 3 ng/L (ref <18)

## 2020-04-11 MED ORDER — AMLODIPINE BESYLATE 5 MG PO TABS: ORAL_TABLET | ORAL | 3 refills | Status: DC

## 2020-04-11 MED ORDER — ROSUVASTATIN CALCIUM 20 MG PO TABS: 20.0000 mg | ORAL_TABLET | Freq: Every day | ORAL | 3 refills | Status: DC

## 2020-04-11 NOTE — Patient Instructions (Signed)
Medication Instructions:  No medication changes today.   *If you need a refill on your cardiac medications before your next appointment, please call your pharmacy*   Lab Work: Your provider recommends that you return for lab work today: troponin, lipid panel, CMP  If you have labs (blood work) drawn today and your tests are completely normal, you will receive your results only by: Marland Kitchen MyChart Message (if you have MyChart) OR . A paper copy in the mail If you have any lab test that is abnormal or we need to change your treatment, we will call you to review the results.   Testing/Procedures: Your EKG today shows normal sinus rhythm which is a great result!   Follow-Up: At [facility], you and your health needs are our priority.  As part of our continuing mission to provide you with exceptional heart care, we have created designated Provider Care Teams.  These Care Teams include your primary Cardiologist (physician) and Advanced Practice Providers (APPs -  Physician Assistants and Nurse Practitioners) who all work together to provide you with the care you need, when you need it.  We recommend signing up for the patient portal called "MyChart".  Sign up information is provided on this After Visit Summary.  MyChart is used to connect with patients for Virtual Visits (Telemedicine).  Patients are able to view lab/test results, encounter notes, upcoming appointments, etc.  Non-urgent messages can be sent to your provider as well.   To learn more about what you can do with MyChart, go to [email or web address].    Your next appointment:   6 month(s)  The format for your next appointment:   In Person  Provider:   You may see Yvonne Kendall, MD or one of the following Advanced Practice Providers on your designated Care Team:    Nicolasa Ducking, NP  Eula Listen, PA-C  Marisue Ivan, PA-C  Cadence Fransico Michael, [place]  Gillian Shields, NP  Other Instructions  Heart Healthy Diet  Recommendations: A low-salt diet is recommended. Meats should be grilled, baked, or boiled. Avoid fried foods. Focus on lean protein sources like fish or chicken with vegetables and fruits. The American Heart Association is a Chief Technology Officer!  American Heart Association Diet and Lifeystyle Recommendations   Exercise recommendations: The American Heart Association recommends 150 minutes of moderate intensity exercise weekly. Try 30 minutes of moderate intensity exercise 4-5 times per week. This could include walking, jogging, or swimming.

## 2020-04-11 NOTE — Progress Notes (Signed)
Office Visit    Patient Name: Brianna Mclean Date of Encounter: 04/11/2020  Primary Care Provider:  Marina Goodell, MD Primary Cardiologist:  Yvonne Kendall, MD Electrophysiologist:  None   Chief Complaint    Brianna Mclean is a 60 y.o. 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 Anxiety   . Bipolar 1 disorder, depressed (HCC)   . Coronary artery disease    a. 07/2017 cath 60% stenosis OM2 likely scad, medical management.  . Hyperlipidemia   . Major depression   . 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 Obsessive compulsive disorder   . PTSD (post-traumatic stress disorder)    Past Surgical History:  Procedure Laterality Date  . CESAREAN SECTION    . COSMETIC SURGERY    . LEFT HEART CATH AND CORONARY ANGIOGRAPHY N/A 08/10/2017   Procedure: LEFT HEART CATH AND CORONARY ANGIOGRAPHY;  Surgeon: Yvonne Kendall, MD;  Location: [facility] INVASIVE CV LAB;  Service: Cardiovascular;  Laterality: N/A;  . MOUTH SURGERY  02/2019    Allergies  Allergies  Allergen Reactions  . Percocet [Oxycodone-Acetaminophen] Itching    History of Present Illness    Brianna Mclean is a 60 y.o. 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 She was last seen 10/17/19.  She was seen 04/2019 with noted increased stress, dealing with her sons death as well as a divorce. She noted some intermittent shortness of breath which she attributed to deconditioning. Echo was discussed, but deferred.  Seen 08/2019 with continued dyspnea with exertion. Also noted two episodes of chest pain associated with breathlessness at rest lasting several hours  before self resolving. She was recommended for echocardiogram.   Echo 09/28/19 with LVEF 60-65%, no RWMA, RV normal size and function, mild MR.   Labs via Care Everywhere 10/01/19:  Hb 13.4, AST 15, ALT 13, creatinine 0.8, K 4.4, GFR 90, A1c 5.2, TSH 1.678,   Total cholesterol 153, triglyceride 136, HDL 49.6, LDL 76  She was last seen 03/21/98. Per her report some of her lab work collected at PCP were elevated (likely CRP/ANA which are unavailable for review) and she has an appoint with rheumatology as they were worried about rheumatoid arthritis. Her echocardiogram was reviewed. She reported stable fatigue and dyspnea on exertion. She was seen by rheumatology 11/07/19 and diagnosed with rheumatoid arthritis and started on a course of prednisone as well as Methotrexate.  Shares with me that she did not take the prednisone nor methotrexate due to concern for side effects.  She took something over-the-counter that sounds like a glucosamine supplement for a few months but did report inflammation with her pain and joint swelling.  Presents today for follow-up.  Reports approximately 1 month history of intermittent chest discomfort.  This occurs at rest and with activity.  Happens predominantly during times of stress.  She has been helping her daughter who is a single mother take care of her grandchildren who are twin 1-year-old girls and a boy in kindergarten.  Describes the chest pain as a midsternal chest discomfort.  It feels like indigestion that "just stays there ".  No belching or gas.  No noted relieving factors.  Aggravated  by stress, as above.  Endorses eating low-sodium, heart healthy diet.  No formal exercise routine but tells me she is very active playing with her grandchildren.  EKGs/Labs/Other Studies Reviewed:   The following studies were reviewed today: Echo 09/28/19  1. Left ventricular ejection fraction, by estimation, is 60 to 65%. The  left ventricle has normal function. The left  ventricle has no regional  wall motion abnormalities. Left ventricular diastolic parameters are  indeterminate.   2. Right ventricular systolic function is normal. The right ventricular  size is normal.   3. The mitral valve is normal in structure. Mild mitral valve  regurgitation.   EKG:  EKG is ordered today.  The ekg ordered today demonstrates NSR 73 bpm with no acute ST/T wave changes.   Recent Labs: 04/16/2019: ALT 14  Recent Lipid Panel    Component Value Date/Time   CHOL 169 04/16/2019 1214   TRIG 71 04/16/2019 1214   HDL 53 04/16/2019 1214   CHOLHDL 3.2 04/16/2019 1214   CHOLHDL 6.2 08/11/2017 0448   VLDL 17 08/11/2017 0448   LDLCALC 102 (H) 04/16/2019 1214   LDLDIRECT 108 (H) 04/16/2019 1214    Home Medications   No outpatient medications have been marked as taking for the 04/11/20 encounter (Appointment) with Alver Sorrow, NP.      Review of Systems   Review of Systems  Constitutional: Negative for chills, fever and malaise/fatigue.  Cardiovascular: Positive for chest pain (with stress). Negative for dyspnea on exertion, leg swelling, near-syncope, orthopnea, palpitations and syncope.  Respiratory: Negative for cough, shortness of breath and wheezing.   Gastrointestinal: Negative for nausea and vomiting.  Neurological: Negative for dizziness, light-headedness and weakness.   All other systems reviewed and are otherwise negative except as noted above.  Physical Exam    VS:  There were no vitals taken for this visit. , BMI There is no height or weight on file to calculate BMI. GEN: Well nourished, well developed, in no acute distress. HEENT: normal. Neck: Supple, no JVD, carotid bruits, or masses. Cardiac: RRR, no murmurs, rubs, or gallops. No clubbing, cyanosis, edema.  Radials/DP/PT 2+ and equal bilaterally.  Respiratory:  Respirations regular and unlabored, clear to auscultation bilaterally. GI: Soft, nontender, nondistended, BS + x 4. MS: No deformity  or atrophy. Skin: Warm and dry, no rash. Neuro:  Strength and sensation are intact. Psych: Normal affect.  Assessment & Plan    1. CAD with history of SCAD -reports 1 month history of midsternal chest discomfort at rest and with activity which is exacerbated by stress.  We discussed that stress was the likely etiology as her EKG today is unremarkable and her symptoms are atypical for angina.  Stat troponin was checked at her request and was normal.  Reassurance provided.  EKG today NSR with no acute ST/2 changes.  Echo 09/2019 with normal LVEF and no regional wall motion abnormalities.  GDMT includes amlodipine 5 mg daily, aspirin 80 mg daily, Crestor 20 mg daily.  Refills provided.  No indication for ischemic evaluation at this time.  Continue low-sodium, heart healthy diet.  2. HLD -LDL goal less than 70.  LDL today 103.  Does endorse missed doses of Crestor.  We have refilled her Crestor 20 mg daily she was encouraged to take as prescribed.  Anticipate this will improve her cholesterol at goal however if remains above 70 could consider increased dose to 40 mg.  3. Mild MR - Noted by echo 09/2019.  Continue optimal  blood pressure control with BP goal less than 130/80.  4. Anxiety/depression-follows with her primary care provider as well as psychiatry.  She is considering establishing with a psychologist for therapy and this was encouraged.  Lots of recent stress related to caring for grandchildren.  She is having some chest discomfort which is likely related to her stress as her high-sensitivity troponin was unremarkable and EKG is normal sinus rhythm.  Reassurance provided.  Disposition: Follow up in 6 month(s) with Dr. Okey Dupre or APP   Alver Sorrow, NP 04/11/2020, 7:50 AM

## 2021-04-21 ENCOUNTER — Other Ambulatory Visit: Payer: Self-pay | Admitting: Gerontology

## 2021-04-21 DIAGNOSIS — Z1231 Encounter for screening mammogram for malignant neoplasm of breast: Secondary | ICD-10-CM

## 2021-05-27 ENCOUNTER — Other Ambulatory Visit: Payer: Self-pay

## 2021-05-27 ENCOUNTER — Ambulatory Visit
Admission: RE | Admit: 2021-05-27 | Discharge: 2021-05-27 | Disposition: A | Discharge status: Home / Self Care | Payer: Medicare Other | Source: Ambulatory Visit | Attending: Gerontology | Admitting: Gerontology

## 2021-05-27 DIAGNOSIS — Z1231 Encounter for screening mammogram for malignant neoplasm of breast: Secondary | ICD-10-CM | POA: Insufficient documentation

## 2021-05-30 ENCOUNTER — Other Ambulatory Visit: Payer: Self-pay | Admitting: Family

## 2021-05-30 DIAGNOSIS — I25118 Atherosclerotic heart disease of native coronary artery with other forms of angina pectoris: Secondary | ICD-10-CM

## 2021-05-30 DIAGNOSIS — E785 Hyperlipidemia, unspecified: Secondary | ICD-10-CM

## 2021-06-01 ENCOUNTER — Other Ambulatory Visit: Payer: Self-pay | Admitting: Family

## 2021-06-01 DIAGNOSIS — E785 Hyperlipidemia, unspecified: Secondary | ICD-10-CM

## 2021-06-01 DIAGNOSIS — I25118 Atherosclerotic heart disease of native coronary artery with other forms of angina pectoris: Secondary | ICD-10-CM

## 2021-06-01 NOTE — Telephone Encounter (Signed)
Rx(s) sent to pharmacy electronically.  

## 2021-07-09 ENCOUNTER — Encounter: Payer: Self-pay | Admitting: Internal Medicine

## 2021-07-09 ENCOUNTER — Ambulatory Visit: Payer: Medicare Other | Admitting: Internal Medicine

## 2021-07-09 VITALS — BP 140/80 | HR 62 | Ht 63.5 in | Wt 164.0 lb

## 2021-07-09 DIAGNOSIS — I10 Essential (primary) hypertension: Secondary | ICD-10-CM | POA: Insufficient documentation

## 2021-07-09 DIAGNOSIS — I2542 Coronary artery dissection: Secondary | ICD-10-CM

## 2021-07-09 MED ORDER — ROSUVASTATIN CALCIUM 20 MG PO TABS: 20.0000 mg | ORAL_TABLET | Freq: Every day | ORAL | 3 refills | Status: DC

## 2021-07-09 MED ORDER — AMLODIPINE BESYLATE 5 MG PO TABS: 5.0000 mg | ORAL_TABLET | Freq: Every day | ORAL | 3 refills | Status: DC

## 2021-07-09 NOTE — Patient Instructions (Signed)
Medication Instructions:  ? ?Your physician recommends that you continue on your current medications as directed. Please refer to the Current Medication list given to you today. ? ?*If you need a refill on your cardiac medications before your next appointment, please call your pharmacy* ? ? ?Lab Work: ? ?None ordered ? ?Testing/Procedures: ? ?None ordered ? ? ?Follow-Up: ?At [facility], you and your health needs are our priority.  As part of our continuing mission to provide you with exceptional heart care, we have created designated Provider Care Teams.  These Care Teams include your primary Cardiologist (physician) and Advanced Practice Providers (APPs -  Physician Assistants and Nurse Practitioners) who all work together to provide you with the care you need, when you need it. ? ?We recommend signing up for the patient portal called "MyChart".  Sign up information is provided on this After Visit Summary.  MyChart is used to connect with patients for Virtual Visits (Telemedicine).  Patients are able to view lab/test results, encounter notes, upcoming appointments, etc.  Non-urgent messages can be sent to your provider as well.   ?To learn more about what you can do with MyChart, go to [email or web address].   ? ?Your next appointment:   ?1 year(s) ? ?The format for your next appointment:   ?In Person ? ?Provider:   ?You may see Christopher End, MD or one of the following Advanced Practice Providers on your designated Care Team:   ?Christopher Berge, NP ?Ryan Dunn, PA-C ?Cadence Furth, PA-C ? ?Important Information About Sugar ? ? ? ? ? ? ?

## 2021-07-09 NOTE — Progress Notes (Signed)
? ?Follow-up Outpatient Visit ?Date: 07/09/2021 ? ?Primary Care Provider: ?Luciana Axe, NP ?[address] ?[place] [address] ? ?Chief Complaint: Follow-up spontaneous coronary artery dissection ? ?HPI:  Brianna Mclean is a 61 y.o. 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  She was last seen in our office in 03/2020 by Gillian Shields, NP, at which time she complained of a 1 month history of intermittent chest pain predominantly present during stress.  EKG was unremarkable.  High-sensitivity troponin I was also normal.  No further testing or intervention was pursued. ? ?Today, Brianna Mclean reports that she has been feeling quite well.  She denies chest pain, shortness of breath, palpitations, and lightheadedness.  She is trying to change her diet and focus on more plant based foods. ? ?-------------------------------------------------------------------------------------------------- ? ?Past Medical History:  ?Diagnosis Date  ? Anxiety   ? Bipolar 1 disorder, depressed (HCC)   ? Coronary artery disease   ? a. 07/2017 cath 60% stenosis OM2 likely scad, medical management.  ? Hyperlipidemia   ? Major depression   ? NSTEMI (non-ST elevated myocardial infarction) (HCC) 08/10/2017  ? SCAD (less likely vasospasm) of OM branch - medical management  ? Obsessive compulsive disorder   ? PTSD (post-traumatic stress disorder)   ? ?Past Surgical History:  ?Procedure Laterality Date  ? CESAREAN SECTION    ? COSMETIC SURGERY    ? LEFT HEART CATH AND CORONARY ANGIOGRAPHY N/A 08/10/2017  ? Procedure: LEFT HEART CATH AND CORONARY ANGIOGRAPHY;  Surgeon: Yvonne Kendall, MD;  Location: [facility] INVASIVE CV LAB;  Service: Cardiovascular;  Laterality: N/A;  ? MOUTH SURGERY  02/2019  ? ? ?Current Meds  ?Medication Sig  ? amLODipine (NORVASC) 5 MG tablet Take 1 tablet  (5 mg total) by mouth daily. NEED APPOINTMENT  ? aspirin EC 81 MG tablet Take 81 mg by mouth daily. Swallow whole.  ? LORazepam (ATIVAN) 2 MG tablet Take 1 tablet by mouth 2 (two) times daily as needed.  ? rosuvastatin (CRESTOR) 20 MG tablet Take 1 tablet (20 mg total) by mouth daily. NEED APPOINTMENT  ? venlafaxine XR (EFFEXOR-XR) 75 MG 24 hr capsule Take 225 mg by mouth daily. With food  ? ? ?Allergies: Percocet [oxycodone-acetaminophen] ? ?Social History  ? ?Tobacco Use  ? Smoking status: Never  ? Smokeless tobacco: Never  ?Vaping Use  ? Vaping Use: Never used  ?Substance Use Topics  ? Alcohol use: Yes  ?  Comment: occasional cocktail  ? Drug use: No  ? ? ?Family History  ?Problem Relation Age of Onset  ? Anxiety disorder Mother   ? Hyperlipidemia Mother   ? Stroke Mother   ? Parkinson's disease Mother   ? Depression Father   ? Breast cancer Paternal Grandmother   ? ? ?Review of Systems: ?A 12-system review of systems was performed and was negative except as noted in the HPI. ? ?-------------------------------------------------------------------------------------------------- ? ?Physical Exam: ?BP 140/80 (BP Location: Left Arm, Patient Position: Sitting, Cuff Size: Normal)   Pulse 62   Ht 5' 3.5" (1.613 m)   Wt 164 lb (74.4 kg)   SpO2 98%   BMI 28.60 kg/m?  ?Repeat BP: 132/76 ? ?General:  NAD. ?Neck: No JVD or HJR. ?Lungs: Clear to auscultation bilaterally without wheezes or crackles. ?Heart: Regular rate and rhythm without murmurs, rubs, or gallops. ?Abdomen: Soft, nontender, nondistended. ?Extremities: No lower  extremity edema. ? ?EKG: Normal sinus rhythm without abnormality. ? ?Lab Results  ?Component Value Date  ? WBC 7.2 08/11/2017  ? HGB 13.4 08/11/2017  ? HCT 39.0 08/11/2017  ? MCV 82.5 08/11/2017  ? PLT 221 08/11/2017  ? ? ?Lab Results  ?Component Value Date  ? NA 143 04/11/2020  ? K 4.0 04/11/2020  ? CL 105 04/11/2020  ? CO2 28 04/11/2020  ? BUN 12 04/11/2020  ? CREATININE 0.79 04/11/2020  ? GLUCOSE  97 04/11/2020  ? ALT 15 04/11/2020  ? ? ?Lab Results  ?Component Value Date  ? CHOL 168 04/11/2020  ? HDL 47 04/11/2020  ? LDLCALC 103 (H) 04/11/2020  ? LDLDIRECT 108 (H) 04/16/2019  ? TRIG 88 04/11/2020  ? CHOLHDL 3.6 04/11/2020  ? ? ?-------------------------------------------------------------------------------------------------- ? ?ASSESSMENT AND PLAN: ?Spontaneous coronary artery dissection: ?No angina reported.  Continue secondary prevention with aspirin and rosuvastatin.  Labs through PCP in February were reasonable. ? ?Hypertension: ?Initial blood pressure mildly elevated, improved on recheck.  Continue current dose of amlodipine for blood pressure control and treatment of potential component of vasospasm leading to prior NSTEMI. ? ?Follow-up: Return to clinic in 1 year. ? ?Yvonne Kendall, MD ?07/09/2021 ?10:45 AM ? ?

## 2021-08-28 ENCOUNTER — Telehealth: Payer: Self-pay | Admitting: Internal Medicine

## 2021-08-28 NOTE — Telephone Encounter (Signed)
   Pre-operative Risk Assessment    Patient Name: Brianna Mclean  DOB: 1960/07/18 MRN: [id number]{      Request for Surgical Clearance    Procedure:  Dental Extraction - Amount of Teeth to be Pulled:  3   Date of Surgery:  Clearance TBD                                Surgeon:  DR ADAM SWAN DDS Surgeon's Group or Practice Name:  [facility] Phone number:  [phone] Fax number:  [phone]   Type of Clearance Requested:   - Medical   Type of Anesthesia:  Local   Additional requests/questions:    Signed, Dalia Heading   08/28/2021, 12:25 PM

## 2021-08-28 NOTE — Telephone Encounter (Signed)
   Primary Cardiologist: Yvonne Kendall, MD  Chart reviewed as part of pre-operative protocol coverage. Given past medical history and time since last visit, based on ACC/AHA guidelines, Brianna Mclean would be at acceptable risk for the planned procedure without further cardiovascular testing.   She does not require SBE prophylaxis prior to her dental extractions.  I will route this recommendation to the requesting party via Epic fax function and remove from pre-op pool.  Please call with questions.  Thomasene Ripple. Jermy Couper NP-C    08/28/2021, 1:30 PM [facility] [address] Office [phone] Fax [phone]

## 2021-09-02 NOTE — Telephone Encounter (Signed)
Elon Jester, office manager with [facility], following up. Recommendation was faxed to incorrect number. Please resend to [phone].  Patient's dental work is scheduled for today.

## 2021-09-02 NOTE — Telephone Encounter (Signed)
Re-faxed clearance to [phone], as requested.

## 2022-09-12 ENCOUNTER — Other Ambulatory Visit: Payer: Self-pay | Admitting: Internal Medicine

## 2022-09-12 DIAGNOSIS — I10 Essential (primary) hypertension: Secondary | ICD-10-CM

## 2022-09-12 DIAGNOSIS — I2542 Coronary artery dissection: Secondary | ICD-10-CM

## 2022-09-13 NOTE — Telephone Encounter (Signed)
Please schedule overdue F/U appt for further refills. Thank you! 

## 2022-09-14 NOTE — Telephone Encounter (Signed)
APPT ON 09/15/22

## 2022-09-15 ENCOUNTER — Ambulatory Visit: Payer: Medicare Other | Attending: Medical | Admitting: Medical

## 2022-09-15 ENCOUNTER — Other Ambulatory Visit
Admission: RE | Admit: 2022-09-15 | Discharge: 2022-09-15 | Disposition: A | Discharge status: Home / Self Care | Payer: Medicare Other | Attending: Medical | Admitting: Medical

## 2022-09-15 ENCOUNTER — Encounter: Payer: Self-pay | Admitting: Medical

## 2022-09-15 VITALS — BP 110/70 | HR 62 | Ht 63.5 in | Wt 164.4 lb

## 2022-09-15 DIAGNOSIS — I2542 Coronary artery dissection: Secondary | ICD-10-CM

## 2022-09-15 DIAGNOSIS — I10 Essential (primary) hypertension: Secondary | ICD-10-CM | POA: Diagnosis not present

## 2022-09-15 DIAGNOSIS — I2510 Atherosclerotic heart disease of native coronary artery without angina pectoris: Secondary | ICD-10-CM

## 2022-09-15 DIAGNOSIS — E782 Mixed hyperlipidemia: Secondary | ICD-10-CM

## 2022-09-15 DIAGNOSIS — R079 Chest pain, unspecified: Secondary | ICD-10-CM | POA: Diagnosis not present

## 2022-09-15 LAB — COMPREHENSIVE METABOLIC PANEL
ALT: 23 U/L (ref 0–44)
AST: 21 U/L (ref 15–41)
Albumin: 3.9 g/dL (ref 3.5–5.0)
Alkaline Phosphatase: 74 U/L (ref 38–126)
Anion gap: 8 (ref 5–15)
BUN: 15 mg/dL (ref 6–20)
CO2: 28 mmol/L (ref 22–32)
Calcium: 9.1 mg/dL (ref 8.9–10.3)
Chloride: 104 mmol/L (ref 98–111)
Creatinine, Ser: 0.87 mg/dL (ref 0.44–1.00)
GFR, Estimated: >60 mL/min (ref >60)
Glucose, Bld: 88 mg/dL (ref 70–99)
Potassium: 3.9 mmol/L (ref 3.5–5.1)
Sodium: 140 mmol/L (ref 135–145)
Total Bilirubin: 1 mg/dL (ref 0.3–1.2)
Total Protein: 7.1 g/dL (ref 6.5–8.1)

## 2022-09-15 LAB — CBC
HCT: 42.8 % (ref 36.0–46.0)
Hemoglobin: 14.4 g/dL (ref 12.0–15.0)
MCH: 27.5 pg (ref 26.0–34.0)
MCHC: 33.6 g/dL (ref 30.0–36.0)
MCV: 81.8 fL (ref 80.0–100.0)
Platelets: 207 10*3/uL (ref 150–400)
RBC: 5.23 MIL/uL — ABNORMAL HIGH (ref 3.87–5.11)
RDW: 13 % (ref 11.5–15.5)
WBC: 6.5 10*3/uL (ref 4.0–10.5)
nRBC: 0 % (ref 0.0–0.2)

## 2022-09-15 LAB — LIPID PANEL
Cholesterol: 149 mg/dL (ref 0–200)
HDL: 40 mg/dL — ABNORMAL LOW (ref >40)
LDL Cholesterol: 73 mg/dL (ref 0–99)
Total CHOL/HDL Ratio: 3.7 RATIO
Triglycerides: 179 mg/dL — ABNORMAL HIGH (ref <150)
VLDL: 36 mg/dL (ref 0–40)

## 2022-09-15 LAB — TSH: TSH: 1.843 u[IU]/mL (ref 0.350–4.500)

## 2022-09-15 MED ORDER — AMLODIPINE BESYLATE 5 MG PO TABS: ORAL_TABLET | ORAL | 3 refills | Status: DC

## 2022-09-15 MED ORDER — ROSUVASTATIN CALCIUM 20 MG PO TABS: 20.0000 mg | ORAL_TABLET | Freq: Every day | ORAL | 3 refills | Status: AC

## 2022-09-15 NOTE — Progress Notes (Signed)
Cardiology Office Note:    Date:  09/15/2022   ID:  Brianna Mclean, DOB 1960-12-10, MRN [id number]  PCP:  Brianna Axe, NP  [facility] Cardiologist:  Yvonne Kendall, MD  [facility] Electrophysiologist:  None   Referring MD: Brianna Axe, NP   Chief Complaint: 1 year follow-up  History of Present Illness:    Brianna Mclean is a 62 y.o. female with a hx of CAD with NSTEMI secondary to SCAD of OM branch 07/2017, anxiety/depression, PTSD who presents for 1 year follow-up.   Echo 09/2019 showed LVEF 60-65%, no WMA, normal RV size, mild MR.   The patient was last seen 06/2021 and was overall stable from a cardiac perspective.   Today, the patient reports recent exacerbation of anxiety and depression. She feels very stressed out and has occasional chest tightness. When she gets worked up she has a strange chest pain feeling on the left side. It's a tightness that lasts a few minutes. It's worse with exertion. She has a  little shortness of breath. She is now working on the weekends. Also with pain in the right upper leg that is aching. She does not do regular exercise. Has not had blood work in the last year.   Past Medical History:  Diagnosis Date   Anxiety    Bipolar 1 disorder, depressed (HCC)    Coronary artery disease    a. 07/2017 cath 60% stenosis OM2 likely scad, medical management.   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 - medical management   Obsessive compulsive disorder    PTSD (post-traumatic stress disorder)     Past Surgical History:  Procedure Laterality Date   CESAREAN SECTION     COSMETIC SURGERY     LEFT HEART CATH AND CORONARY ANGIOGRAPHY N/A 08/10/2017   Procedure: LEFT HEART CATH AND CORONARY ANGIOGRAPHY;  Surgeon: Yvonne Kendall, MD;  Location: [facility] INVASIVE CV LAB;  Service: Cardiovascular;  Laterality: N/A;   MOUTH SURGERY  02/2019     Current Medications: Current Meds  Medication Sig   aspirin EC 81 MG tablet Take 81 mg by mouth daily. Swallow whole.   LORazepam (ATIVAN) 2 MG tablet Take 1 tablet by mouth 2 (two) times daily as needed.   venlafaxine XR (EFFEXOR-XR) 75 MG 24 hr capsule Take 225 mg by mouth daily. With food   [DISCONTINUED] amLODipine (NORVASC) 5 MG tablet TAKE 1 TABLET(5 MG) BY MOUTH DAILY   [DISCONTINUED] rosuvastatin (CRESTOR) 20 MG tablet Take 1 tablet (20 mg total) by mouth daily.     Allergies:   Percocet [oxycodone-acetaminophen]   Social History   Socioeconomic History   Marital status: Single    Spouse name: Not on file   Number of children: Not on file   Years of education: Not on file   Highest education level: Not on file  Occupational History   Not on file  Tobacco Use   Smoking status: Never   Smokeless tobacco: Never  Vaping Use   Vaping Use: Never used  Substance and Sexual Activity   Alcohol use: Yes    Comment: occasional cocktail   Drug use: No   Sexual activity: Never  Other Topics Concern   Not on file  Social History Narrative   Not on file   Social Determinants of Health   Financial Resource Strain: Not on file  Food Insecurity: Not on file  Transportation Needs: Not on file  Physical Activity: Not on file  Stress: Not on file  Social Connections: Not on file     Family History: The patient's family history includes Anxiety disorder in her mother; Breast cancer in her paternal grandmother; Depression in her father; Hyperlipidemia in her mother; Parkinson's disease in her mother; Stroke in her mother.  ROS:   Please see the history of present illness.     All other systems reviewed and are negative.  EKGs/Labs/Other Studies Reviewed:    The following studies were reviewed today:  Echo 2021  1. Left ventricular ejection fraction, by estimation, is 60 to 65%. The  left ventricle has normal function. The left ventricle has no regional  wall motion  abnormalities. Left ventricular diastolic parameters are  indeterminate.   2. Right ventricular systolic function is normal. The right ventricular  size is normal.   3. The mitral valve is normal in structure. Mild mitral valve  regurgitation.   EKG:  EKG is ordered today.  The ekg ordered today demonstrates NSR 62bpm, nonspecific T wave changes  Recent Labs: No results found for requested labs within last 365 days.  Recent Lipid Panel    Component Value Date/Time   CHOL 168 04/11/2020 0947   CHOL 169 04/16/2019 1214   TRIG 88 04/11/2020 0947   HDL 47 04/11/2020 0947   HDL 53 04/16/2019 1214   CHOLHDL 3.6 04/11/2020 0947   VLDL 18 04/11/2020 0947   LDLCALC 103 (H) 04/11/2020 0947   LDLCALC 102 (H) 04/16/2019 1214   LDLDIRECT 108 (H) 04/16/2019 1214     Physical Exam:    VS:  BP 110/70 (BP Location: Left Arm, Patient Position: Sitting, Cuff Size: Normal)   Pulse 62   Ht 5' 3.5" (1.613 m)   Wt 164 lb 6 oz (74.6 kg)   SpO2 98%   BMI 28.66 kg/m     Wt Readings from Last 3 Encounters:  09/15/22 164 lb 6 oz (74.6 kg)  07/09/21 164 lb (74.4 kg)  04/11/20 164 lb (74.4 kg)     GEN:  Well nourished, well developed in no acute distress HEENT: Normal NECK: No JVD; No carotid bruits LYMPHATICS: No lymphadenopathy CARDIAC: RRR, no murmurs, rubs, gallops RESPIRATORY:  Clear to auscultation without rales, wheezing or rhonchi  ABDOMEN: Soft, non-tender, non-distended MUSCULOSKELETAL:  No edema; No deformity  SKIN: Warm and dry NEUROLOGIC:  Alert and oriented x 3 PSYCHIATRIC:  Normal affect   ASSESSMENT:    1. Chest pain of uncertain etiology   2. Spontaneous dissection of coronary artery   3. Coronary artery disease involving native coronary artery of native heart without angina pectoris   4. Essential hypertension   5. Hyperlipidemia, mixed    PLAN:    In order of problems listed above:  Atypical chest pain SCAD Nonobstructive CAD Patient reports left sided chest  tightness that is not worse with exertion. She has extreme anxiety and depression and it may be related to the chest tightness. EKG today with no ischemic changes. H/o SCAD, but unsure if this is similar pain. She takes amlodipine 5mg  daily. I will order a GXT. Continue Aspirin 81mg  daily, Crestor 20mg  daily, and amlodipine 5mg  daily. I will check general labs today: CBC, CMET, lipid panel and TSH.  HTN BP is normal, continue amlodipine 5mg  daily.   HLD LDL 71 in 2023. Continue Crestor 20mg  daily. Check lipid panel today.   Disposition: Follow up in 2 month(s) with MD/APP  Shared Decision Making/Informed Consent   Informed Consent   Shared Decision Making/Informed Consent The risks [chest pain, shortness of breath, cardiac arrhythmias, dizziness, blood pressure fluctuations, myocardial infarction, stroke/transient ischemic attack, and life-threatening complications (estimated to be 1 in 10,000)], benefits (risk stratification, diagnosing coronary artery disease, treatment guidance) and alternatives of an exercise tolerance test were discussed in detail with Ms. Brys and she agrees to proceed.       Signed, Karon Heckendorn David Stall, PA-C  09/15/2022 11:47 AM    [facility]

## 2022-09-15 NOTE — Patient Instructions (Signed)
Medication Instructions:  No changes *If you need a refill on your cardiac medications before your next appointment, please call your pharmacy*   Lab Work: Your provider would like for you to have following labs drawn: CMET, CBC, TSH and Lipid.   Please go to the [facility] entrance and check in at the front desk.  You do not need an appointment.  They are open from 7am-6 pm.   If you have labs (blood work) drawn today and your tests are completely normal, you will receive your results only by: MyChart Message (if you have MyChart) OR A paper copy in the mail If you have any lab test that is abnormal or we need to change your treatment, we will call you to review the results.   Testing/Procedures: Your provider has ordered a exercise tolerance test. This test will evaluate the blood supply to your heart muscle during periods of exercise and rest. For this test, you will raise your heart rate by walking on a treadmill at different levels.   you may eat a light breakfast/ lunch prior to your procedure no caffeine for 24 hours prior to your test (coffee, tea, soft drinks, or chocolate)  no smoking/ vaping for 4 hours prior to your test you may take your regular medications the day of your test except for:   - noting to hold bring any inhalers with you to your test wear comfortable clothing & tennis/ non-skid shoes to walk on the treadmill  This will take place at [address], [place] [address]    Follow-Up: At [facility], you and your health needs are our priority.  As part of our continuing mission to provide you with exceptional heart care, we have created designated Provider Care Teams.  These Care Teams include your primary Cardiologist (physician) and Advanced Practice Providers (APPs -  Physician Assistants and Nurse Practitioners) who all work together to provide you with the care you need, when you need it.  We recommend  signing up for the patient portal called "MyChart".  Sign up information is provided on this After Visit Summary.  MyChart is used to connect with patients for Virtual Visits (Telemedicine).  Patients are able to view lab/test results, encounter notes, upcoming appointments, etc.  Non-urgent messages can be sent to your provider as well.   To learn more about what you can do with MyChart, go to [email or web address].    Your next appointment:   2 month(s)  Provider:   You may see Yvonne Kendall, MD or one of the following Advanced Practice Providers on your designated Care Team:   Nicolasa Ducking, NP Eula Listen, PA-C Cadence Fransico Michael, PA-C Charlsie Quest, NP

## 2022-09-28 ENCOUNTER — Ambulatory Visit: Payer: Medicare Other | Admitting: Medical

## 2022-10-15 ENCOUNTER — Ambulatory Visit: Payer: Medicare Other

## 2022-11-17 ENCOUNTER — Ambulatory Visit: Payer: Medicare Other | Admitting: Internal Medicine

## 2023-03-02 ENCOUNTER — Other Ambulatory Visit: Payer: Self-pay | Admitting: Gerontology

## 2023-03-02 DIAGNOSIS — Z1231 Encounter for screening mammogram for malignant neoplasm of breast: Secondary | ICD-10-CM

## 2023-08-15 ENCOUNTER — Ambulatory Visit
Admission: RE | Admit: 2023-08-15 | Discharge: 2023-08-15 | Disposition: A | Discharge status: Home / Self Care | Source: Ambulatory Visit | Attending: Gerontology | Admitting: Gerontology

## 2023-08-15 DIAGNOSIS — Z1231 Encounter for screening mammogram for malignant neoplasm of breast: Secondary | ICD-10-CM | POA: Diagnosis present

## 2024-03-28 ENCOUNTER — Telehealth: Payer: Self-pay | Admitting: Internal Medicine

## 2024-03-28 NOTE — Progress Notes (Signed)
 "  Cardiology Office Note    Date:  03/29/2024   ID:  Brianna Mclean, Brianna Mclean 11/09/1960, MRN [id number]  PCP:  Steva Clotilda DEL, NP  Cardiologist:  Lonni Hanson, MD  Electrophysiologist:  None   Chief Complaint: Follow up  History of Present Illness:   Brianna Mclean is a 64 y.o. 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    Patient was admitted 07/2017 with chest pain.  Emergent LHC revealed narrowing of OM 2 suggestive of scad with spontaneous recanalization.  No other significant CAD was noted.  She was medically managed.  Echo showed EF 55 to 60%.  She underwent renal artery ultrasound 08/2017 given scad to exclude FMD which was negative.  Most recent echo 09/2019 revealed EF 60 to 65% with mild MR.   Patient was most recently seen in the cardiology clinic 09/2022.  She reported recent exacerbation of anxiety and depression with occasional chest tightness.  She reported feeling a strange chest pain on the left side when she called worked up, worse with exertion.  GXT was ordered but not completed.  Patient presents today with concern for elevated blood pressure. She reports having muscle soreness from rosuvastatin  causing her to discontinue it. She also reports that she has not taken amlodipine  or aspirin  in quite some time. She's not sure when or why she stopped taking them. She endorses increased stress and depression recently. She can feel that her blood pressure is elevated and reports readings typically in the 130s-140s systolic. She also endorses intermittent feelings of heart racing which typically corresponds with increased stress levels. She denies chest pain, shortness of breath, lightheadedness, dizziness, and lower extremity swelling.   Labs independently reviewed: 02/2023-Hgb 13.5, HCT 40.3, platelets 214, sodium 141, potassium 4.1,  BUN 15, creatinine 0.9, TC 174, TG 157, HDL 43, LDL 99  Objective   Past Medical History:  Diagnosis Date   Anxiety    Bipolar 1 disorder, depressed (HCC)    Coronary artery disease    a. 07/2017 cath 60% stenosis OM2 likely scad, medical management.   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 - medical management   Obsessive compulsive disorder    PTSD (post-traumatic stress disorder)     Current Medications: Active Medications[1]  Allergies:   Oxycodone-acetaminophen    Social History   Socioeconomic History   Marital status: Single    Spouse name: Not on file   Number of children: Not on file   Years of education: Not on file   Highest education level: Not on file  Occupational History   Not on file  Tobacco Use   Smoking status: Never   Smokeless tobacco: Never  Vaping Use   Vaping status: Never Used  Substance and Sexual Activity   Alcohol use: Yes    Comment: occasional cocktail   Drug use: No   Sexual activity: Never  Other Topics Concern   Not on file  Social History Narrative   Not on file   Social Drivers of Health   Tobacco Use: Low Risk (03/29/2024)   Patient History    Smoking Tobacco Use: Never    Smokeless Tobacco Use: Never    Passive Exposure: Not on file  Financial Resource Strain: Low Risk  (03/02/2023)   Received  from [facility]   Overall Financial Resource Strain (CARDIA)    Difficulty of Paying Living Expenses: Not hard at all  Food Insecurity: No Food Insecurity (03/02/2023)   Received from [facility]   Epic    Within the past 12 months, you worried that your food would run out before you got the money to buy more.: Never true    Within the past 12 months, the food you bought just didn't last and you didn't have money to get more.: Never true  Transportation Needs: No Transportation Needs  (03/02/2023)   Received from [facility] - Transportation    In the past 12 months, has lack of transportation kept you from medical appointments or from getting medications?: No    Lack of Transportation (Non-Medical): No  Physical Activity: Not on file  Stress: Not on file  Social Connections: Not on file  Depression (EYV7-0): Not on file  Alcohol Screen: Not on file  Housing: Low Risk  (03/02/2023)   Received from [facility]   Epic    In the last 12 months, was there a time when you were not able to pay the mortgage or rent on time?: No    In the past 12 months, how many times have you moved where you were living?: 0    At any time in the past 12 months, were you homeless or living in a shelter (including now)?: No  Utilities: Not At Risk (03/02/2023)   Received from [facility] Utilities    Threatened with loss of utilities: No  Health Literacy: Not on file     Family History:  The patient's family history includes Anxiety disorder in her mother; Breast cancer in her paternal grandmother; Depression in her father; Hyperlipidemia in her mother; Parkinson's disease in her mother; Stroke in her mother.  ROS:   12-point review of systems is negative unless otherwise noted in the HPI.  EKGs/Other Studies Reviewed:    Studies reviewed were summarized above. The additional studies were reviewed today:  09/2019 2D echo 1. Left ventricular ejection fraction, by estimation, is 60 to 65%. The  left ventricle has normal function. The left ventricle has no regional  wall motion abnormalities. Left ventricular diastolic parameters are  indeterminate.   2. Right ventricular systolic function is normal. The right ventricular  size is normal.   3. The mitral valve is normal in structure. Mild mitral valve  regurgitation.   EKG:  EKG personally reviewed by me today EKG Interpretation Date/Time:  Thursday March 29 2024 09:54:46 EST Ventricular Rate:  59 PR Interval:  176 QRS Duration:  82 QT Interval:  406 QTC Calculation: 401 R Axis:   25  Text Interpretation: Sinus bradycardia When compared with ECG of 11-Aug-2017 00:25, Nonspecific T wave abnormality now evident in Inferior leads Confirmed by Lorene Sinclair ([id number]) on 03/29/2024 10:00:29 AM  PHYSICAL EXAM:    VS:  BP (!) 144/88 (BP Location: Left Arm, Patient Position: Sitting)   Pulse (!) 59   Ht 5' 3.5 (1.613 m)   Wt 157 lb 12.8 oz (71.6 kg)   SpO2 99%   BMI 27.51 kg/m   BMI: Body mass index is 27.51 kg/m.  GEN: Well nourished, well developed in no acute distress NECK: No JVD; No carotid bruits CARDIAC: RRR, no murmurs, rubs, gallops RESPIRATORY:  Clear to auscultation without rales, wheezing or rhonchi  ABDOMEN:  Soft, non-tender, non-distended EXTREMITIES: No edema; No deformity  Wt Readings from Last 3 Encounters:  03/29/24 157 lb 12.8 oz (71.6 kg)  09/15/22 164 lb 6 oz (74.6 kg)  07/09/21 164 lb (74.4 kg)                  ASSESSMENT & PLAN:   Palpitations - Patient endorses intermittent episodes of heart racing, associated with increased stress.  Recommend 2-week ZIO XT to further assess.  SCAD - History of scad of the OM1 in 2019.  Denies chest pain.  Recommended resuming aspirin  81 mg daily.  Hypertension - BP mildly elevated in office today and per at home readings.  Recommend resuming amlodipine  5 mg daily.  Hyperlipidemia - Most recent lipid panel 02/2023 with LDL 99.  She has since stopped taking statin due to myopathy.  Recommend updating lipid panel with further recommendations pending results.    Disposition: Obtain 14-day ZIO XT.  Resume amlodipine  5 mg daily and aspirin  81 mg daily.  F/u with Dr. Mady or an APP in 6 months or sooner as needed.   Medication Adjustments/Labs and Tests Ordered: Current medicines are reviewed at length with the patient today.  Concerns regarding medicines are outlined  above. Medication changes, Labs and Tests ordered today are summarized above and listed in the Patient Instructions accessible in Encounters.   Bonney Lesley Maffucci, PA-C 03/29/2024 10:33 AM     [facility] [address] [place], [place] [address] [phone]      [1]  Current Meds  Medication Sig   aspirin  EC 81 MG tablet Take 81 mg by mouth daily. Swallow whole.   atomoxetine (STRATTERA) 80 MG capsule Take 80 mg by mouth every morning.   LORazepam  (ATIVAN ) 2 MG tablet Take 1 tablet by mouth 2 (two) times daily as needed.   venlafaxine  XR (EFFEXOR -XR) 75 MG 24 hr capsule Take 225 mg by mouth daily. With food   [DISCONTINUED] amLODipine  (NORVASC ) 5 MG tablet TAKE 1 TABLET(5 MG) BY MOUTH DAILY   "

## 2024-03-28 NOTE — Telephone Encounter (Signed)
 Pt c/o BP issue: STAT if pt c/o blurred vision, one-sided weakness or slurred speech.  STAT if BP is GREATER than 180/120 TODAY.  STAT if BP is LESS than 90/60 and SYMPTOMATIC TODAY  1. What is your BP concern? Hypertension   2. Have you taken any BP medication today? No   3. What are your last 5 BP readings?no   4. Are you having any other symptoms (ex. Dizziness, headache, blurred vision, passed out)? Palpitations   Pt has been having high  bp and palpitations. She does not have any BP or HR readings with her. Scheduled 1/15 with EMERSON Maffucci.

## 2024-03-29 ENCOUNTER — Ambulatory Visit

## 2024-03-29 ENCOUNTER — Ambulatory Visit: Attending: Physician Assistant | Admitting: Physician Assistant

## 2024-03-29 ENCOUNTER — Encounter: Payer: Self-pay | Admitting: Physician Assistant

## 2024-03-29 VITALS — BP 144/88 | HR 59 | Ht 63.5 in | Wt 157.8 lb

## 2024-03-29 DIAGNOSIS — I10 Essential (primary) hypertension: Secondary | ICD-10-CM | POA: Diagnosis not present

## 2024-03-29 DIAGNOSIS — R002 Palpitations: Secondary | ICD-10-CM

## 2024-03-29 DIAGNOSIS — I2542 Coronary artery dissection: Secondary | ICD-10-CM

## 2024-03-29 DIAGNOSIS — E785 Hyperlipidemia, unspecified: Secondary | ICD-10-CM | POA: Diagnosis not present

## 2024-03-29 DIAGNOSIS — R079 Chest pain, unspecified: Secondary | ICD-10-CM

## 2024-03-29 MED ORDER — AMLODIPINE BESYLATE 5 MG PO TABS: ORAL_TABLET | ORAL | 3 refills | Status: AC

## 2024-03-29 NOTE — Patient Instructions (Addendum)
 Medication Instructions:  Your physician recommends the following medication changes.  START TAKING: Aspirin  81 mg Amlodipine  5 mg Daily   *If you need a refill on your cardiac medications before your next appointment, please call your pharmacy*  Lab Work: Your provider would like for you to have following labs drawn today Lipid Panel.   If you have labs (blood work) drawn today and your tests are completely normal, you will receive your results only by: MyChart Message (if you have MyChart) OR A paper copy in the mail If you have any lab test that is abnormal or we need to change your treatment, we will call you to review the results.  Testing/Procedures: Brianna Mclean- Long Term Monitor Instructions  Your physician has requested you wear a ZIO patch monitor for 14 days.  This is a single patch monitor. [employer] supplies one patch monitor per enrollment. Additional stickers are not available. Please do not apply patch if you will be having a Nuclear Stress Test, Echocardiogram, Cardiac CT, MRI, or Chest Xray during the period you would be wearing the monitor. The patch cannot be worn during these tests. You cannot remove and re-apply the ZIO XT patch monitor.  Your ZIO patch monitor will be mailed 3 day USPS to your address on file. It may take 3-5 days to receive your monitor after you have been enrolled. Once you have received your monitor, please review the enclosed instructions. Your monitor has already been registered assigning a specific monitor serial number to you.  Billing and Patient Assistance Program Information  We have supplied [employer] with any of your insurance information on file for billing purposes.  [employer] offers a sliding scale Patient Assistance Program for patients that do not have insurance, or whose insurance does not completely cover the cost of the ZIO monitor.  You must apply for the Patient Assistance Program to qualify for this discounted rate.  To apply, please call  [employer] at [phone], select option 4, select option 2, ask to apply for Patient Assistance Program. Meredeth will ask your household income, and how many people are in your household. They will quote your out-of-pocket cost based on that information. [employer] will also be able to set up a 69-month, interest-free payment plan if needed.  Applying the monitor   Shave hair from upper left chest.  Hold abrader disc by orange tab. Rub abrader in 40 strokes over the upper left chest as indicated in your monitor instructions.  Clean area with 4 enclosed alcohol pads. Let dry.  Apply patch as indicated in monitor instructions. Patch will be placed under collarbone on left side of chest with arrow pointing upward.  Rub patch adhesive wings for 2 minutes. Remove white label marked 1. Remove the white label marked 2. Rub patch adhesive wings for 2 additional minutes.  While looking in a mirror, press and release button in center of patch. A small green light will flash 3-4 times. This will be your only indicator that the monitor has been turned on.  Do not shower for the first 24 hours. You may shower after the first 24 hours.  Press the button if you feel a symptom. You will hear a small click. Record Date, Time and Symptom in the Patient Logbook.  When you are ready to remove the patch, follow instructions on the last 2 pages of Patient Logbook.  Stick patch monitor into the tabs at the bottom of the return box.  Place Patient Logbook in the blue and  white box. Use locking tab on box and tape box closed securely. The blue and white box has prepaid postage on it. Please place it in the mailbox as soon as possible. Your physician should have your test results approximately 7-14 days after the monitor has been mailed back to [facility].  Call [facility] Customer Care at [phone] if you have questions regarding your ZIO XT patch monitor.  Call them immediately if you see an orange light  blinking on your monitor.  If your monitor falls off in less than 4 days, contact our Monitor department at [phone].  If your monitor becomes loose or falls off after 4 days call [employer] at [phone] for suggestions on securing your monitor.    Follow-Up: At [facility], you and your health needs are our priority.  As part of our continuing mission to provide you with exceptional heart care, our providers are all part of one team.  This team includes your primary Cardiologist (physician) and Advanced Practice Providers or APPs (Physician Assistants and Nurse Practitioners) who all work together to provide you with the care you need, when you need it.  Your next appointment:   6 month(s)  Provider:   You may see Lonni Hanson, MD or one of the following Advanced Practice Providers on your designated Care Team:   Lonni Meager, NP Lesley Maffucci, PA-C Bernardino Bring, PA-C Cadence [place], PA-C Tylene Lunch, NP Barnie Hila, NP    We recommend signing up for the patient portal called MyChart.  Sign up information is provided on this After Visit Summary.  MyChart is used to connect with patients for Virtual Visits (Telemedicine).  Patients are able to view lab/test results, encounter notes, upcoming appointments, etc.  Non-urgent messages can be sent to your provider as well.   To learn more about what you can do with MyChart, go to [email or web address].   Other Instructions

## 2024-03-30 ENCOUNTER — Ambulatory Visit: Payer: Self-pay | Admitting: Physician Assistant

## 2024-03-30 DIAGNOSIS — E785 Hyperlipidemia, unspecified: Secondary | ICD-10-CM

## 2024-03-30 LAB — LIPID PANEL
Chol/HDL Ratio: 8.3 ratio — ABNORMAL HIGH (ref 0.0–4.4)
Cholesterol, Total: 289 mg/dL — ABNORMAL HIGH (ref 100–199)
HDL: 35 mg/dL — ABNORMAL LOW
LDL Chol Calc (NIH): 217 mg/dL — ABNORMAL HIGH (ref 0–99)
Triglycerides: 188 mg/dL — ABNORMAL HIGH (ref 0–149)
VLDL Cholesterol Cal: 37 mg/dL (ref 5–40)

## 2024-08-29 ENCOUNTER — Ambulatory Visit: Admitting: Physician Assistant
